# Patient Record
Sex: Male | Born: 1980 | Race: White | Hispanic: No | Marital: Single | Smoking: Current every day smoker
Health system: Southern US, Community
[De-identification: ages and names within clinical notes are randomized; demographics above are authoritative.]

---

## 2018-09-15 ENCOUNTER — Other Ambulatory Visit: Payer: Self-pay

## 2018-09-15 ENCOUNTER — Emergency Department (HOSPITAL_COMMUNITY): Payer: Self-pay

## 2018-09-15 ENCOUNTER — Encounter (HOSPITAL_COMMUNITY): Payer: Self-pay | Admitting: Emergency Medicine

## 2018-09-15 ENCOUNTER — Emergency Department (HOSPITAL_COMMUNITY)
Admission: EM | Admit: 2018-09-15 | Discharge: 2018-09-16 | Disposition: A | Discharge status: Home / Self Care | Payer: Self-pay | Attending: Emergency Medicine | Admitting: Emergency Medicine

## 2018-09-15 DIAGNOSIS — S51821A Laceration with foreign body of right forearm, initial encounter: Secondary | ICD-10-CM | POA: Insufficient documentation

## 2018-09-15 DIAGNOSIS — S301XXA Contusion of abdominal wall, initial encounter: Secondary | ICD-10-CM | POA: Insufficient documentation

## 2018-09-15 DIAGNOSIS — Y999 Unspecified external cause status: Secondary | ICD-10-CM | POA: Insufficient documentation

## 2018-09-15 DIAGNOSIS — S30811A Abrasion of abdominal wall, initial encounter: Secondary | ICD-10-CM | POA: Insufficient documentation

## 2018-09-15 DIAGNOSIS — S51822A Laceration with foreign body of left forearm, initial encounter: Secondary | ICD-10-CM | POA: Insufficient documentation

## 2018-09-15 DIAGNOSIS — F17200 Nicotine dependence, unspecified, uncomplicated: Secondary | ICD-10-CM | POA: Insufficient documentation

## 2018-09-15 DIAGNOSIS — S01112A Laceration without foreign body of left eyelid and periocular area, initial encounter: Secondary | ICD-10-CM | POA: Insufficient documentation

## 2018-09-15 DIAGNOSIS — Z1883 Retained stone or crystalline fragments: Secondary | ICD-10-CM | POA: Insufficient documentation

## 2018-09-15 DIAGNOSIS — Z23 Encounter for immunization: Secondary | ICD-10-CM | POA: Insufficient documentation

## 2018-09-15 DIAGNOSIS — S0101XA Laceration without foreign body of scalp, initial encounter: Secondary | ICD-10-CM | POA: Insufficient documentation

## 2018-09-15 DIAGNOSIS — Y92410 Unspecified street and highway as the place of occurrence of the external cause: Secondary | ICD-10-CM | POA: Insufficient documentation

## 2018-09-15 DIAGNOSIS — S30810A Abrasion of lower back and pelvis, initial encounter: Secondary | ICD-10-CM | POA: Insufficient documentation

## 2018-09-15 DIAGNOSIS — Y9389 Activity, other specified: Secondary | ICD-10-CM | POA: Insufficient documentation

## 2018-09-15 LAB — I-STAT CHEM 8, ED
BUN: 15 mg/dL (ref 6–20)
CHLORIDE: 112 mmol/L — AB (ref 98–111)
CREATININE: 0.9 mg/dL (ref 0.61–1.24)
Calcium, Ion: 1.09 mmol/L — ABNORMAL LOW (ref 1.15–1.40)
GLUCOSE: 128 mg/dL — AB (ref 70–99)
HCT: 44 % (ref 39.0–52.0)
Hemoglobin: 15 g/dL (ref 13.0–17.0)
POTASSIUM: 3.9 mmol/L (ref 3.5–5.1)
Sodium: 144 mmol/L (ref 135–145)
TCO2: 18 mmol/L — ABNORMAL LOW (ref 22–32)

## 2018-09-15 LAB — COMPREHENSIVE METABOLIC PANEL
ALK PHOS: 61 U/L (ref 38–126)
ALT: 31 U/L (ref 0–44)
AST: 39 U/L (ref 15–41)
Albumin: 4.2 g/dL (ref 3.5–5.0)
Anion gap: 12 (ref 5–15)
BUN: 13 mg/dL (ref 6–20)
CHLORIDE: 111 mmol/L (ref 98–111)
CO2: 20 mmol/L — AB (ref 22–32)
Calcium: 8.8 mg/dL — ABNORMAL LOW (ref 8.9–10.3)
Creatinine, Ser: 0.89 mg/dL (ref 0.61–1.24)
GLUCOSE: 124 mg/dL — AB (ref 70–99)
Potassium: 3.8 mmol/L (ref 3.5–5.1)
SODIUM: 143 mmol/L (ref 135–145)
Total Bilirubin: 0.4 mg/dL (ref 0.3–1.2)
Total Protein: 7.1 g/dL (ref 6.5–8.1)

## 2018-09-15 LAB — CDS SEROLOGY

## 2018-09-15 LAB — I-STAT CG4 LACTIC ACID, ED: Lactic Acid, Venous: 3.89 mmol/L (ref 0.5–1.9)

## 2018-09-15 LAB — CBC
HEMATOCRIT: 44.2 % (ref 39.0–52.0)
Hemoglobin: 13.8 g/dL (ref 13.0–17.0)
MCH: 31.2 pg (ref 26.0–34.0)
MCHC: 31.2 g/dL (ref 30.0–36.0)
MCV: 99.8 fL (ref 80.0–100.0)
NRBC: 0 % (ref 0.0–0.2)
Platelets: 345 10*3/uL (ref 150–400)
RBC: 4.43 MIL/uL (ref 4.22–5.81)
RDW: 13.6 % (ref 11.5–15.5)
WBC: 14 10*3/uL — ABNORMAL HIGH (ref 4.0–10.5)

## 2018-09-15 LAB — SAMPLE TO BLOOD BANK

## 2018-09-15 LAB — PROTIME-INR
INR: 0.94
PROTHROMBIN TIME: 12.5 s (ref 11.4–15.2)

## 2018-09-15 LAB — ETHANOL: Alcohol, Ethyl (B): 62 mg/dL — ABNORMAL HIGH (ref <10)

## 2018-09-15 MED ORDER — CEFAZOLIN SODIUM-DEXTROSE 2-4 GM/100ML-% IV SOLN
2.0000 g | Freq: Once | INTRAVENOUS | Status: AC
  Administered 2018-09-15: 2 g via INTRAVENOUS

## 2018-09-15 MED ORDER — CEFAZOLIN SODIUM-DEXTROSE 2-4 GM/100ML-% IV SOLN
INTRAVENOUS | Status: AC
  Filled 2018-09-15: qty 100

## 2018-09-15 MED ORDER — SODIUM CHLORIDE 0.9 % IV SOLN
INTRAVENOUS | Status: AC | PRN
  Administered 2018-09-15: 1000 mL via INTRAVENOUS

## 2018-09-15 MED ORDER — LIDOCAINE-EPINEPHRINE (PF) 2 %-1:200000 IJ SOLN
40.0000 mL | Freq: Once | INTRAMUSCULAR | Status: AC
  Administered 2018-09-16: 20 mL
  Filled 2018-09-15: qty 40

## 2018-09-15 MED ORDER — LIDOCAINE HCL (PF) 1 % IJ SOLN
60.0000 mL | Freq: Once | INTRAMUSCULAR | Status: DC
  Filled 2018-09-15: qty 60

## 2018-09-15 MED ORDER — FENTANYL CITRATE (PF) 100 MCG/2ML IJ SOLN
INTRAMUSCULAR | Status: AC
  Administered 2018-09-15: 50 ug
  Filled 2018-09-15: qty 2

## 2018-09-15 MED ORDER — TETANUS-DIPHTH-ACELL PERTUSSIS 5-2.5-18.5 LF-MCG/0.5 IM SUSP
INTRAMUSCULAR | Status: AC
  Filled 2018-09-15: qty 0.5

## 2018-09-15 MED ORDER — TETANUS-DIPHTH-ACELL PERTUSSIS 5-2.5-18.5 LF-MCG/0.5 IM SUSP
0.5000 mL | Freq: Once | INTRAMUSCULAR | Status: AC
  Administered 2018-09-15: 0.5 mL via INTRAMUSCULAR

## 2018-09-15 MED ORDER — IOHEXOL 300 MG/ML  SOLN
100.0000 mL | Freq: Once | INTRAMUSCULAR | Status: AC | PRN
  Administered 2018-09-15: 100 mL via INTRAVENOUS
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
## 2018-09-15 NOTE — ED Notes (Signed)
ED Provider at bedside for sutures. 
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
## 2018-09-15 NOTE — ED Notes (Signed)
Patient transported to CT 

## 2018-09-15 NOTE — ED Notes (Signed)
Nash Dimmer (wife) [phone] Chanetta Marshall (dad) [phone]

## 2018-09-16 MED ORDER — FENTANYL CITRATE (PF) 100 MCG/2ML IJ SOLN
50.0000 ug | Freq: Once | INTRAMUSCULAR | Status: AC
  Administered 2018-09-16: 50 ug via INTRAVENOUS
  Filled 2018-09-16: qty 2

## 2018-09-16 MED ORDER — DOXYCYCLINE HYCLATE 100 MG PO CAPS: 100.0000 mg | ORAL_CAPSULE | Freq: Two times a day (BID) | ORAL | 0 refills | Status: DC
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
## 2018-09-16 NOTE — Discharge Instructions (Addendum)
Follow up with trauma wound clinic.  Sutures and staples need to be removed in the next 7 to 10 days.  Keep abrasions and lacerations clean and dry with warm soapy water.  Apply bacitracin.  Wounds were contaminated so beware if you develop a fever, worsening redness, or pus draining from them.    Take 1 tablet of doxycycline 2 times daily for the next 7 days to prevent infection from your wounds.  Expect your soreness to increase over the next 2-3 days. Take it easy, but do not lay around too much as this may make any stiffness worse.  Antiinflammatory medications: Take 600 mg of ibuprofen every 6 hours or 440 mg (over the counter dose) to 500 mg (prescription dose) of naproxen every 12 hours for the next 3 days. After this time, these medications may be used as needed for pain. Take these medications with food to avoid upset stomach. Choose only one of these medications, do not take them together. Acetaminophen (generic for Tylenol): Should you continue to have additional pain while taking the ibuprofen or naproxen, you may add in acetaminophen as needed. Your daily total maximum amount of acetaminophen from all sources should be limited to 4000mg /day for persons without liver problems, or 2000mg /day for those with liver problems.

## 2018-10-05 ENCOUNTER — Other Ambulatory Visit: Payer: Self-pay

## 2018-10-05 ENCOUNTER — Encounter: Payer: Self-pay | Admitting: Emergency Medicine

## 2018-10-05 ENCOUNTER — Emergency Department
Admission: EM | Admit: 2018-10-05 | Discharge: 2018-10-05 | Disposition: A | Discharge status: Home / Self Care | Payer: Self-pay | Attending: Emergency Medicine | Admitting: Emergency Medicine

## 2018-10-05 DIAGNOSIS — S0191XD Laceration without foreign body of unspecified part of head, subsequent encounter: Secondary | ICD-10-CM | POA: Insufficient documentation

## 2018-10-05 DIAGNOSIS — Z4802 Encounter for removal of sutures: Secondary | ICD-10-CM | POA: Insufficient documentation

## 2018-10-05 DIAGNOSIS — F17200 Nicotine dependence, unspecified, uncomplicated: Secondary | ICD-10-CM | POA: Insufficient documentation

## 2018-10-05 NOTE — ED Provider Notes (Signed)
[facility] Emergency Department Provider Note   ____________________________________________   First MD Initiated Contact with Patient 10/05/18 1113     (approximate)  I have reviewed the triage vital signs and the nursing notes.   HISTORY  Chief Complaint Suture / Staple Removal   HPI ZERICK PREVETTE is a 37 y.o. male resents to the ED for staple removal.  Patient was seen at [facility] ED and received staples to a head laceration.  He states that this was approximately 3 weeks ago and he has not had transportation to have these removed until now.  He denies any symptoms of infection or headache.  History reviewed. No pertinent past medical history.  There are no active problems to display for this patient.   History reviewed. No pertinent surgical history.  Prior to Admission medications   Not on File    Allergies Patient has no known allergies.  History reviewed. No pertinent family history.  Social History Social History   Tobacco Use  . Smoking status: Current Every Day Smoker  . Smokeless tobacco: Never Used  Substance Use Topics  . Alcohol use: Never    Frequency: Never  . Drug use: Never    Review of Systems Constitutional: No fever/chills Eyes: No visual changes. ENT: No sore throat. Cardiovascular: Denies chest pain. Respiratory: Denies shortness of breath. Neurological: Negative for headaches, focal weakness or numbness. ___________________________________________   PHYSICAL EXAM:  VITAL SIGNS: ED Triage Vitals  Enc Vitals Group     BP 10/05/18 1105 (!) 148/80     Pulse Rate 10/05/18 1105 (!) 112     Resp 10/05/18 1105 20     Temp 10/05/18 1105 97.6 F (36.4 C)     Temp Source 10/05/18 1105 Oral     SpO2 10/05/18 1105 100 %     Weight 10/05/18 1106 159 lb (72.1 kg)     Height 10/05/18 1106 5\' 6"  (1.676 m)     Head Circumference --      Peak Flow --      Pain Score 10/05/18 1106 0     Pain Loc --      Pain Edu?  --      Excl. in GC? --    Constitutional: Alert and oriented. Well appearing and in no acute distress. Eyes: Conjunctivae are normal. PERRL. EOMI. Head: Atraumatic. Neck: No stridor.   Cardiovascular: Normal rate, regular rhythm. Grossly normal heart sounds.  Good peripheral circulation. Respiratory: Normal respiratory effort.  No retractions. Lungs CTAB. Musculoskeletal: Moves upper and lower extremities that any difficulty.  Normal gait was noted. Neurologic:  Normal speech and language. No gross focal neurologic deficits are appreciated.  Skin:  Skin is warm, dry and intact.  Psychiatric: Mood and affect are normal. Speech and behavior are normal.  ____________________________________________   LABS (all labs ordered are listed, but only abnormal results are displayed)  Labs Reviewed - No data to display   PROCEDURES  Procedure(s) performed: None  Procedures  Critical Care performed: No  ____________________________________________   INITIAL IMPRESSION / ASSESSMENT AND PLAN / ED COURSE  As part of my medical decision making, I reviewed the following data within the electronic MEDICAL RECORD NUMBER Notes from prior ED visits and [place] Controlled Substance Database  Patient presents to the ED for staple removal.  He was seen at [facility] ED for his injury.  He states he has not had any problems since and has had transportation problems getting staples removed and the  7-day period.  Area has healed without any signs of infection.  Patient was told that he may take Tylenol if needed for any pain.  ____________________________________________   FINAL CLINICAL IMPRESSION(S) / ED DIAGNOSES  Final diagnoses:  Encounter for staple removal     ED Discharge Orders    None       Note:  This document was prepared using Dragon voice recognition software and may include unintentional dictation errors.    Tommi Rumps, PA-C 10/05/18 1127    Nita Sickle, MD 10/06/18  [phone]
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
## 2018-10-05 NOTE — ED Notes (Signed)
See triage note  Here for staple removal from scalp   States they have been in place for about 3 weeks  No redness or drainage

## 2018-10-05 NOTE — Discharge Instructions (Addendum)
Clean area daily with mild soap and water.  Continue to watch for any signs of infection and return to the ED or be seen at [facility] acute care if any continued problems. You may take Tylenol if needed for pain today.

## 2018-10-30 ENCOUNTER — Emergency Department
Admission: EM | Admit: 2018-10-30 | Discharge: 2018-10-30 | Disposition: A | Discharge status: Home / Self Care | Payer: Self-pay | Attending: Emergency Medicine | Admitting: Emergency Medicine

## 2018-10-30 ENCOUNTER — Other Ambulatory Visit: Payer: Self-pay

## 2018-10-30 ENCOUNTER — Encounter: Payer: Self-pay | Admitting: Emergency Medicine

## 2018-10-30 DIAGNOSIS — F17200 Nicotine dependence, unspecified, uncomplicated: Secondary | ICD-10-CM | POA: Insufficient documentation

## 2018-10-30 DIAGNOSIS — S0101XD Laceration without foreign body of scalp, subsequent encounter: Secondary | ICD-10-CM | POA: Insufficient documentation

## 2018-10-30 DIAGNOSIS — Z4802 Encounter for removal of sutures: Secondary | ICD-10-CM

## 2018-10-30 DIAGNOSIS — W268XXD Contact with other sharp object(s), not elsewhere classified, subsequent encounter: Secondary | ICD-10-CM | POA: Insufficient documentation

## 2018-10-30 NOTE — ED Triage Notes (Signed)
staple L scalp. States had others removed previously, when he got a haircut this one showed still in.

## 2018-10-30 NOTE — ED Provider Notes (Signed)
[facility] Emergency Department Provider Note  ____________________________________________  Time seen: Approximately 10:43 AM  I have reviewed the triage vital signs and the nursing notes.   HISTORY  Chief Complaint Suture / Staple Removal    HPI Faythe CasaWestly W Cua is a 37 y.o. male presents to emergency department for one staple removal.  Patient states that he had staples removed on 11 6 here in the emergency department.  He went to get his haircut and they noticed he still had one staple in place.  He denies any complications.  History reviewed. No pertinent past medical history.  There are no active problems to display for this patient.   History reviewed. No pertinent surgical history.  Prior to Admission medications   Not on File    Allergies Patient has no known allergies.  No family history on file.  Social History Social History   Tobacco Use  . Smoking status: Current Every Day Smoker  . Smokeless tobacco: Never Used  Substance Use Topics  . Alcohol use: Never    Frequency: Never  . Drug use: Never     Review of Systems  Constitutional: No fever/chills Gastrointestinal: No nausea, no vomiting.  Musculoskeletal: Negative for musculoskeletal pain. Skin: Negative for rash, ecchymosis. Neurological: Negative for headaches, numbness or tingling   ____________________________________________   PHYSICAL EXAM:  VITAL SIGNS: ED Triage Vitals  Enc Vitals Group     BP 10/30/18 1024 121/85     Pulse Rate 10/30/18 1024 100     Resp 10/30/18 1024 18     Temp 10/30/18 1024 97.9 F (36.6 C)     Temp Source 10/30/18 1024 Oral     SpO2 10/30/18 1024 99 %     Weight 10/30/18 1026 165 lb (74.8 kg)     Height 10/30/18 1026 5\' 8"  (1.727 m)     Head Circumference --      Peak Flow --      Pain Score 10/30/18 1026 0     Pain Loc --      Pain Edu? --      Excl. in GC? --      Constitutional: Alert and oriented. Well appearing and  in no acute distress. Eyes: Conjunctivae are normal. PERRL. EOMI. Head:  ENT:      Ears:      Nose: No congestion/rhinnorhea.      Mouth/Throat: Mucous membranes are moist.  Neck: No stridor.  Cardiovascular: Normal rate, regular rhythm.  Good peripheral circulation. Respiratory: Normal respiratory effort without tachypnea or retractions. Lungs CTAB. Good air entry to the bases with no decreased or absent breath sounds. Musculoskeletal: Full range of motion to all extremities. No gross deformities appreciated. Neurologic:  Normal speech and language. No gross focal neurologic deficits are appreciated.  Skin:  Skin is warm, dry.  One staple in place to left scalp.  No tenderness.  No drainage. Psychiatric: Mood and affect are normal. Speech and behavior are normal. Patient exhibits appropriate insight and judgement.   ____________________________________________   LABS (all labs ordered are listed, but only abnormal results are displayed)  Labs Reviewed - No data to display ____________________________________________  EKG   ____________________________________________  RADIOLOGY   No results found.  ____________________________________________    PROCEDURES  Procedure(s) performed:    Procedures  SUTURE REMOVAL  Consent: Verbal consent obtained. Patient identity confirmed: provided demographic data Time out: Immediately prior to procedure a "time out" was called to verify the correct patient, procedure, equipment, support  staff and site/side marked as required.  Location details: scalp  Wound Appearance: clean  Sutures/Staples Removed: 1  Facility: staples placed in this facility Patient tolerance: Patient tolerated the procedure well with no immediate complications.    Medications - No data to display   ____________________________________________   INITIAL IMPRESSION / ASSESSMENT AND PLAN / ED COURSE  Pertinent labs & imaging results that were  available during my care of the patient were reviewed by me and considered in my medical decision making (see chart for details).  Review of the [place] CSRS was performed in accordance of the [facility] prior to dispensing any controlled drugs.     Patient presented to the emergency department for staple removal.  Patient had staples previously removed in the emergency department but one was missed.  Staple was removed here in the emergency department by RN.  No signs of infection.   Patient is to follow up with primary care as directed. Patient is given ED precautions to return to the ED for any worsening or new symptoms.     ____________________________________________  FINAL CLINICAL IMPRESSION(S) / ED DIAGNOSES  Final diagnoses:  Encounter for staple removal      NEW MEDICATIONS STARTED DURING THIS VISIT:  ED Discharge Orders    None          This chart was dictated using voice recognition software/Dragon. Despite best efforts to proofread, errors can occur which can change the meaning. Any change was purely unintentional.    Enid Derry, PA-C 10/30/18 1525    Governor Rooks, MD 11/01/18 1011

## 2018-10-30 NOTE — ED Notes (Signed)
See triage note  Here for 1 staple to be removed from left side of head
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
## 2022-06-23 ENCOUNTER — Emergency Department (HOSPITAL_COMMUNITY): Payer: Self-pay

## 2022-06-23 ENCOUNTER — Inpatient Hospital Stay (HOSPITAL_COMMUNITY)
Admission: EM | Admit: 2022-06-23 | Discharge: 2022-06-27 | DRG: 100 | Disposition: A | Discharge status: Home / Self Care | Payer: Self-pay | Attending: Internal Medicine | Admitting: Internal Medicine

## 2022-06-23 DIAGNOSIS — N170 Acute kidney failure with tubular necrosis: Secondary | ICD-10-CM | POA: Diagnosis present

## 2022-06-23 DIAGNOSIS — M6282 Rhabdomyolysis: Secondary | ICD-10-CM | POA: Diagnosis present

## 2022-06-23 DIAGNOSIS — E872 Acidosis, unspecified: Secondary | ICD-10-CM | POA: Diagnosis present

## 2022-06-23 DIAGNOSIS — Z23 Encounter for immunization: Secondary | ICD-10-CM

## 2022-06-23 DIAGNOSIS — G928 Other toxic encephalopathy: Secondary | ICD-10-CM | POA: Diagnosis present

## 2022-06-23 DIAGNOSIS — R000 Tachycardia, unspecified: Secondary | ICD-10-CM | POA: Diagnosis present

## 2022-06-23 DIAGNOSIS — Z20822 Contact with and (suspected) exposure to covid-19: Secondary | ICD-10-CM | POA: Diagnosis present

## 2022-06-23 DIAGNOSIS — N179 Acute kidney failure, unspecified: Secondary | ICD-10-CM

## 2022-06-23 DIAGNOSIS — Z781 Physical restraint status: Secondary | ICD-10-CM

## 2022-06-23 DIAGNOSIS — F17200 Nicotine dependence, unspecified, uncomplicated: Secondary | ICD-10-CM | POA: Diagnosis present

## 2022-06-23 DIAGNOSIS — F1010 Alcohol abuse, uncomplicated: Secondary | ICD-10-CM | POA: Diagnosis present

## 2022-06-23 DIAGNOSIS — R569 Unspecified convulsions: Secondary | ICD-10-CM

## 2022-06-23 DIAGNOSIS — G40901 Epilepsy, unspecified, not intractable, with status epilepticus: Principal | ICD-10-CM | POA: Diagnosis present

## 2022-06-23 DIAGNOSIS — F05 Delirium due to known physiological condition: Secondary | ICD-10-CM | POA: Diagnosis not present

## 2022-06-23 DIAGNOSIS — F1410 Cocaine abuse, uncomplicated: Secondary | ICD-10-CM | POA: Diagnosis present

## 2022-06-23 DIAGNOSIS — J9601 Acute respiratory failure with hypoxia: Secondary | ICD-10-CM | POA: Diagnosis present

## 2022-06-23 DIAGNOSIS — G9340 Encephalopathy, unspecified: Secondary | ICD-10-CM

## 2022-06-23 DIAGNOSIS — E875 Hyperkalemia: Secondary | ICD-10-CM | POA: Diagnosis present

## 2022-06-23 DIAGNOSIS — E871 Hypo-osmolality and hyponatremia: Secondary | ICD-10-CM | POA: Diagnosis not present

## 2022-06-23 DIAGNOSIS — J9602 Acute respiratory failure with hypercapnia: Secondary | ICD-10-CM | POA: Diagnosis present

## 2022-06-23 DIAGNOSIS — E860 Dehydration: Secondary | ICD-10-CM | POA: Diagnosis present

## 2022-06-23 DIAGNOSIS — R197 Diarrhea, unspecified: Secondary | ICD-10-CM | POA: Diagnosis present

## 2022-06-23 DIAGNOSIS — Z9911 Dependence on respirator [ventilator] status: Secondary | ICD-10-CM

## 2022-06-23 DIAGNOSIS — F1210 Cannabis abuse, uncomplicated: Secondary | ICD-10-CM | POA: Diagnosis present

## 2022-06-23 DIAGNOSIS — R34 Anuria and oliguria: Secondary | ICD-10-CM | POA: Diagnosis present

## 2022-06-23 LAB — PROTIME-INR
INR: 1.1 (ref 0.8–1.2)
Prothrombin Time: 14.3 seconds (ref 11.4–15.2)

## 2022-06-23 LAB — CBC WITH DIFFERENTIAL/PLATELET
Abs Immature Granulocytes: 0.67 10*3/uL — ABNORMAL HIGH (ref 0.00–0.07)
Band Neutrophils: 0 %
Basophils Absolute: 0.3 10*3/uL — ABNORMAL HIGH (ref 0.0–0.1)
Basophils Relative: 1 %
Blasts: 0 %
Eosinophils Absolute: 0 10*3/uL (ref 0.0–0.5)
Eosinophils Relative: 0 %
HCT: 46.7 % (ref 39.0–52.0)
Hemoglobin: 15.8 g/dL (ref 13.0–17.0)
Lymphocytes Relative: 5 %
Lymphs Abs: 1.7 10*3/uL (ref 0.7–4.0)
MCH: 33.8 pg (ref 26.0–34.0)
MCHC: 33.8 g/dL (ref 30.0–36.0)
MCV: 100 fL (ref 80.0–100.0)
Metamyelocytes Relative: 2 %
Monocytes Absolute: 3.4 10*3/uL — ABNORMAL HIGH (ref 0.1–1.0)
Monocytes Relative: 10 %
Myelocytes: 0 %
Neutro Abs: 27.5 10*3/uL — ABNORMAL HIGH (ref 1.7–7.7)
Neutrophils Relative %: 82 %
Other: 0 %
Platelets: 323 10*3/uL (ref 150–400)
Promyelocytes Relative: 0 %
RBC: 4.67 MIL/uL (ref 4.22–5.81)
RDW: 13.4 % (ref 11.5–15.5)
WBC: 33.6 10*3/uL — ABNORMAL HIGH (ref 4.0–10.5)
nRBC: 0 % (ref 0.0–0.2)
nRBC: 0 /100 WBC

## 2022-06-23 LAB — URINALYSIS, ROUTINE W REFLEX MICROSCOPIC
Bilirubin Urine: NEGATIVE
Glucose, UA: NEGATIVE mg/dL
Ketones, ur: NEGATIVE mg/dL
Leukocytes,Ua: NEGATIVE
Nitrite: NEGATIVE
Protein, ur: 100 mg/dL — AB
Specific Gravity, Urine: 1.018 (ref 1.005–1.030)
pH: 5 (ref 5.0–8.0)

## 2022-06-23 LAB — CK: Total CK: 2920 U/L — ABNORMAL HIGH (ref 49–397)

## 2022-06-23 LAB — COMPREHENSIVE METABOLIC PANEL
ALT: 37 U/L (ref 0–44)
AST: 77 U/L — ABNORMAL HIGH (ref 15–41)
Albumin: 5.1 g/dL — ABNORMAL HIGH (ref 3.5–5.0)
Alkaline Phosphatase: 66 U/L (ref 38–126)
Anion gap: 15 (ref 5–15)
BUN: 31 mg/dL — ABNORMAL HIGH (ref 6–20)
CO2: 20 mmol/L — ABNORMAL LOW (ref 22–32)
Calcium: 9.7 mg/dL (ref 8.9–10.3)
Chloride: 111 mmol/L (ref 98–111)
Creatinine, Ser: 4.07 mg/dL — ABNORMAL HIGH (ref 0.61–1.24)
GFR, Estimated: 18 mL/min — ABNORMAL LOW (ref >60)
Glucose, Bld: 120 mg/dL — ABNORMAL HIGH (ref 70–99)
Potassium: 5.2 mmol/L — ABNORMAL HIGH (ref 3.5–5.1)
Sodium: 146 mmol/L — ABNORMAL HIGH (ref 135–145)
Total Bilirubin: 0.9 mg/dL (ref 0.3–1.2)
Total Protein: 8 g/dL (ref 6.5–8.1)

## 2022-06-23 LAB — RAPID HIV SCREEN (HIV 1/2 AB+AG)
HIV 1/2 Antibodies: NONREACTIVE
HIV-1 P24 Antigen - HIV24: NONREACTIVE

## 2022-06-23 LAB — I-STAT ARTERIAL BLOOD GAS, ED
Acid-base deficit: 2 mmol/L (ref 0.0–2.0)
Bicarbonate: 25.7 mmol/L (ref 20.0–28.0)
Calcium, Ion: 1.25 mmol/L (ref 1.15–1.40)
HCT: 48 % (ref 39.0–52.0)
Hemoglobin: 16.3 g/dL (ref 13.0–17.0)
O2 Saturation: 100 %
Potassium: 4.7 mmol/L (ref 3.5–5.1)
Sodium: 146 mmol/L — ABNORMAL HIGH (ref 135–145)
TCO2: 27 mmol/L (ref 22–32)
pCO2 arterial: 53.8 mmHg — ABNORMAL HIGH (ref 32–48)
pH, Arterial: 7.287 — ABNORMAL LOW (ref 7.35–7.45)
pO2, Arterial: 451 mmHg — ABNORMAL HIGH (ref 83–108)

## 2022-06-23 LAB — RESP PANEL BY RT-PCR (FLU A&B, COVID) ARPGX2
Influenza A by PCR: NEGATIVE
Influenza B by PCR: NEGATIVE
SARS Coronavirus 2 by RT PCR: NEGATIVE

## 2022-06-23 LAB — LACTIC ACID, PLASMA
Lactic Acid, Venous: 3.1 mmol/L (ref 0.5–1.9)
Lactic Acid, Venous: >9 mmol/L (ref 0.5–1.9)

## 2022-06-23 LAB — RAPID URINE DRUG SCREEN, HOSP PERFORMED
Amphetamines: NOT DETECTED
Barbiturates: NOT DETECTED
Benzodiazepines: POSITIVE — AB
Cocaine: POSITIVE — AB
Opiates: NOT DETECTED
Tetrahydrocannabinol: POSITIVE — AB

## 2022-06-23 LAB — CBG MONITORING, ED: Glucose-Capillary: 74 mg/dL (ref 70–99)

## 2022-06-23 LAB — ACETAMINOPHEN LEVEL: Acetaminophen (Tylenol), Serum: <10 ug/mL — ABNORMAL LOW (ref 10–30)

## 2022-06-23 LAB — C DIFFICILE QUICK SCREEN W PCR REFLEX
C Diff antigen: NEGATIVE
C Diff interpretation: NOT DETECTED
C Diff toxin: NEGATIVE

## 2022-06-23 LAB — TROPONIN I (HIGH SENSITIVITY)
Troponin I (High Sensitivity): 315 ng/L (ref <18)
Troponin I (High Sensitivity): 267 ng/L (ref <18)

## 2022-06-23 LAB — SALICYLATE LEVEL: Salicylate Lvl: <7 mg/dL — ABNORMAL LOW (ref 7.0–30.0)

## 2022-06-23 LAB — ETHANOL: Alcohol, Ethyl (B): <10 mg/dL (ref <10)

## 2022-06-23 LAB — APTT: aPTT: 23 seconds — ABNORMAL LOW (ref 24–36)

## 2022-06-23 MED ORDER — LEVETIRACETAM IN NACL 1500 MG/100ML IV SOLN
1500.0000 mg | Freq: Once | INTRAVENOUS | Status: AC
  Administered 2022-06-23: 1500 mg via INTRAVENOUS
  Filled 2022-06-23: qty 100

## 2022-06-23 MED ORDER — VANCOMYCIN HCL IN DEXTROSE 1-5 GM/200ML-% IV SOLN: 1000.0000 mg | Freq: Once | INTRAVENOUS | Status: DC

## 2022-06-23 MED ORDER — FENTANYL 2500MCG IN NS 250ML (10MCG/ML) PREMIX INFUSION
50.0000 ug/h | INTRAVENOUS | Status: DC
  Administered 2022-06-23: 50 ug/h via INTRAVENOUS
  Administered 2022-06-25: 100 ug/h via INTRAVENOUS
  Filled 2022-06-23 (×2): qty 250

## 2022-06-23 MED ORDER — VANCOMYCIN VARIABLE DOSE PER UNSTABLE RENAL FUNCTION (PHARMACIST DOSING): Status: DC

## 2022-06-23 MED ORDER — ROCURONIUM BROMIDE 50 MG/5ML IV SOLN
INTRAVENOUS | Status: DC | PRN
  Administered 2022-06-23: 100 mg via INTRAVENOUS

## 2022-06-23 MED ORDER — PROPOFOL 1000 MG/100ML IV EMUL
0.0000 ug/kg/min | INTRAVENOUS | Status: DC
  Administered 2022-06-23: 20 ug/kg/min via INTRAVENOUS
  Administered 2022-06-24: 30 ug/kg/min via INTRAVENOUS
  Administered 2022-06-24: 20 ug/kg/min via INTRAVENOUS
  Administered 2022-06-24 (×2): 30 ug/kg/min via INTRAVENOUS
  Administered 2022-06-25: 40 ug/kg/min via INTRAVENOUS
  Administered 2022-06-25: 35 ug/kg/min via INTRAVENOUS
  Administered 2022-06-25: 40 ug/kg/min via INTRAVENOUS
  Filled 2022-06-23 (×8): qty 100

## 2022-06-23 MED ORDER — FENTANYL BOLUS VIA INFUSION: 50.0000 ug | INTRAVENOUS | Status: DC | PRN

## 2022-06-23 MED ORDER — LACTATED RINGERS IV SOLN: INTRAVENOUS | Status: AC

## 2022-06-23 MED ORDER — PANTOPRAZOLE 2 MG/ML SUSPENSION
40.0000 mg | Freq: Every day | ORAL | Status: DC
Start: 2022-06-24 — End: 2022-06-26
  Administered 2022-06-24 – 2022-06-25 (×2): 40 mg
  Filled 2022-06-23 (×2): qty 20

## 2022-06-23 MED ORDER — LORAZEPAM 2 MG/ML IJ SOLN
4.0000 mg | INTRAMUSCULAR | Status: DC | PRN
Start: 2022-06-23 — End: 2022-06-26

## 2022-06-23 MED ORDER — SODIUM CHLORIDE 0.9 % IV SOLN
2.0000 g | Freq: Once | INTRAVENOUS | Status: AC
  Administered 2022-06-23: 2 g via INTRAVENOUS
  Filled 2022-06-23: qty 20

## 2022-06-23 MED ORDER — DOCUSATE SODIUM 100 MG PO CAPS: 100.0000 mg | ORAL_CAPSULE | Freq: Two times a day (BID) | ORAL | Status: DC | PRN

## 2022-06-23 MED ORDER — LORAZEPAM 2 MG/ML IJ SOLN
2.0000 mg | Freq: Once | INTRAMUSCULAR | Status: AC
  Administered 2022-06-23: 2 mg via INTRAVENOUS
  Filled 2022-06-23: qty 1

## 2022-06-23 MED ORDER — PANTOPRAZOLE SODIUM 40 MG PO TBEC: 40.0000 mg | DELAYED_RELEASE_TABLET | Freq: Every day | ORAL | Status: DC

## 2022-06-23 MED ORDER — FENTANYL CITRATE PF 50 MCG/ML IJ SOSY
50.0000 ug | PREFILLED_SYRINGE | Freq: Once | INTRAMUSCULAR | Status: AC
  Administered 2022-06-23: 50 ug via INTRAVENOUS
  Filled 2022-06-23: qty 1

## 2022-06-23 MED ORDER — MIDAZOLAM HCL 2 MG/2ML IJ SOLN: 5.0000 mg | Freq: Once | INTRAMUSCULAR | Status: AC

## 2022-06-23 MED ORDER — PROPOFOL 1000 MG/100ML IV EMUL: 5.0000 ug/kg/min | INTRAVENOUS | Status: DC

## 2022-06-23 MED ORDER — POLYETHYLENE GLYCOL 3350 17 G PO PACK: 17.0000 g | PACK | Freq: Every day | ORAL | Status: DC | PRN

## 2022-06-23 MED ORDER — VANCOMYCIN HCL 1750 MG/350ML IV SOLN
1750.0000 mg | Freq: Once | INTRAVENOUS | Status: AC
  Administered 2022-06-23: 1750 mg via INTRAVENOUS
  Filled 2022-06-23: qty 350

## 2022-06-23 MED ORDER — DEXAMETHASONE SODIUM PHOSPHATE 10 MG/ML IJ SOLN
10.0000 mg | Freq: Once | INTRAMUSCULAR | Status: AC
  Administered 2022-06-23: 10 mg via INTRAVENOUS
  Filled 2022-06-23: qty 1

## 2022-06-23 MED ORDER — LORAZEPAM 2 MG/ML IJ SOLN: 2.0000 mg | Freq: Once | INTRAMUSCULAR | Status: AC

## 2022-06-23 MED ORDER — MIDAZOLAM HCL 2 MG/2ML IJ SOLN
INTRAMUSCULAR | Status: AC
  Administered 2022-06-23: 5 mg via INTRAVENOUS
  Filled 2022-06-23: qty 6

## 2022-06-23 MED ORDER — ETOMIDATE 2 MG/ML IV SOLN
INTRAVENOUS | Status: DC | PRN
  Administered 2022-06-23: 20 mg via INTRAVENOUS

## 2022-06-23 MED ORDER — LACTATED RINGERS IV BOLUS (SEPSIS)
1000.0000 mL | Freq: Once | INTRAVENOUS | Status: AC
  Administered 2022-06-23: 1000 mL via INTRAVENOUS

## 2022-06-23 MED ORDER — LACTATED RINGERS IV BOLUS
1000.0000 mL | Freq: Once | INTRAVENOUS | Status: AC
  Administered 2022-06-23: 1000 mL via INTRAVENOUS

## 2022-06-23 MED ORDER — LORAZEPAM 2 MG/ML IJ SOLN
INTRAMUSCULAR | Status: AC
  Administered 2022-06-23: 2 mg via INTRAVENOUS
  Filled 2022-06-23: qty 1

## 2022-06-23 NOTE — Care Plan (Signed)
LTM eeg reviewed till 2144. No seizure, no ictal-interictal activity. Please review final report for details.   Lance Franklin Annabelle Harman

## 2022-06-23 NOTE — Progress Notes (Signed)
Techs waiting on nursing staff to wrap up before EEG placement.
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
## 2022-06-23 NOTE — Progress Notes (Signed)
LTM EEG hooked up and running - no initial skin breakdown - push button tested - neuro notified. No [facility] monitoring; pt is in the ED.

## 2022-06-23 NOTE — Progress Notes (Signed)
Pharmacy Antibiotic Note  Lance Franklin is a 41 y.o. male for which pharmacy has been consulted for vancomycin dosing for meningitis. Patient presenting with AMS. Patient is intubated.  SCr 4.07 WBC 33.6; LA 3.1; T 99.4 F; HR 101; RR 20  Plan: Vancomycin 1750 mg once, subsequent dosing as indicated per random vancomycin level until renal function stable and/or improved, at which time scheduled dosing can be considered Trend WBC, Fever, Renal function, & Clinical course F/u cultures, clinical course, WBC, fever De-escalate when able Levels at steady state  Height: 5\' 8"  (172.7 cm) IBW/kg (Calculated) : 68.4  No data recorded.  No results for input(s): "WBC", "CREATININE", "LATICACIDVEN", "VANCOTROUGH", "VANCOPEAK", "VANCORANDOM", "GENTTROUGH", "GENTPEAK", "GENTRANDOM", "TOBRATROUGH", "TOBRAPEAK", "TOBRARND", "AMIKACINPEAK", "AMIKACINTROU", "AMIKACIN" in the last 168 hours.  CrCl cannot be calculated (Patient's most recent lab result is older than the maximum 21 days allowed.).    No Known Allergies  Antimicrobials this admission: rocephin 7/24 >>  vancomycin 7/24 >>   Microbiology results: Pending  Thank you for allowing pharmacy to be a part of this patient's care.  8/24, PharmD, BCPS 06/23/2022 7:55 PM ED Clinical Pharmacist -  [phone]

## 2022-06-23 NOTE — ED Notes (Signed)
[phone] Chanetta Marshall or Tracey Harries (dad or sister will answer) Please call with an update

## 2022-06-23 NOTE — Sepsis Progress Note (Signed)
Elink monitoring Code Sepsis  

## 2022-06-23 NOTE — ED Triage Notes (Signed)
Pt bib [facility] from work where he was found passed out on the ground by coworker, not responding to questions. With ems, pt had 3 episodes of seizure like activity where his arms were contracted and face was twitching lasting approx 3-4 minutes each. 5mg  versed given pta. Vomited with ems x1. Pt only oriented to self with ems.   HR 170, BP 143/111

## 2022-06-23 NOTE — ED Provider Notes (Signed)
[facility] EMERGENCY DEPARTMENT Provider Note   CSN: [id number] Arrival date & time: 06/23/22  1900     History  Chief Complaint  Patient presents with   Altered Mental Status    Lance Franklin is a 41 y.o. male.  Level 5 caveat for altered mental status and acuity of condition.  Patient brought in by EMS with seizure-like activity.  History is limited.  He was apparently found down at his worksite by coworkers seizing.  No known seizure history but does have a history of substance abuse.  EMS gave him 5 mg of Versed for tonic-clonic seizure activity with posturing.  On arrival he is still seizing moving his arms and legs in rhythmic fashion and not responding to questioning or commands.  He is tachycardic and tachypneic and feels warm. Patient unable to speak or give any history  Per sister Tracey Harries, patient has crack addiction, smokes it. 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 Concern it was laced with fentanyl. No prior history of seizures. No prescribed medication.   The history is provided by the patient and the EMS personnel. The history is limited by the condition of the patient.  Altered Mental Status      Home Medications Prior to Admission medications   Not on File      Allergies    Patient has no known allergies.    Review of Systems   Review of Systems  Unable to perform ROS: Mental status change    Physical Exam Updated Vital Signs BP (!) 89/68   Pulse (!) 110   Temp 99.3 F (37.4 C)   Resp 20   Ht 5\' 8"  (1.727 m)   Wt 77.1 kg   SpO2 100%   BMI 25.85 kg/m  Physical Exam Constitutional:      General: He is in acute distress.     Appearance: He is ill-appearing and toxic-appearing.     Comments: Rhythmic seizing on arrival involving arms and legs.  Upward gaze.  Dry mucous membranes  HENT:     Head: Normocephalic and atraumatic.     Nose: No congestion or rhinorrhea.     Mouth/Throat:      Pharynx: No oropharyngeal exudate.  Eyes:     Extraocular Movements: Extraocular movements intact.     Pupils: Pupils are equal, round, and reactive to light.  Cardiovascular:     Rate and Rhythm: Tachycardia present.  Pulmonary:     Effort: No respiratory distress.  Abdominal:     Tenderness: There is no abdominal tenderness. There is no guarding or rebound.  Musculoskeletal:        General: No swelling or tenderness. Normal range of motion.  Skin:    Comments: Disheveled, foul-smelling, incontinent of urine  Neurological:     Comments: Seizure activity involving arms and legs.  Patient not following commands.  Not moving extremities voluntarily     ED Results / Procedures / Treatments   Labs (all labs ordered are listed, but only abnormal results are displayed) Labs Reviewed  LACTIC ACID, PLASMA - Abnormal; Notable for the following components:      Result Value   Lactic Acid, Venous 3.1 (*)    All other components within normal limits  LACTIC ACID, PLASMA - Abnormal; Notable for the following components:   Lactic Acid, Venous >9.0 (*)    All other components within normal limits  CBC WITH DIFFERENTIAL/PLATELET - Abnormal; Notable for the following components:  WBC 33.6 (*)    Neutro Abs 27.5 (*)    Monocytes Absolute 3.4 (*)    Basophils Absolute 0.3 (*)    Abs Immature Granulocytes 0.67 (*)    All other components within normal limits  COMPREHENSIVE METABOLIC PANEL - Abnormal; Notable for the following components:   Sodium 146 (*)    Potassium 5.2 (*)    CO2 20 (*)    Glucose, Bld 120 (*)    BUN 31 (*)    Creatinine, Ser 4.07 (*)    Albumin 5.1 (*)    AST 77 (*)    GFR, Estimated 18 (*)    All other components within normal limits  ACETAMINOPHEN LEVEL - Abnormal; Notable for the following components:   Acetaminophen (Tylenol), Serum <10 (*)    All other components within normal limits  SALICYLATE LEVEL - Abnormal; Notable for the following components:    Salicylate Lvl <7.0 (*)    All other components within normal limits  RAPID URINE DRUG SCREEN, HOSP PERFORMED - Abnormal; Notable for the following components:   Cocaine POSITIVE (*)    Benzodiazepines POSITIVE (*)    Tetrahydrocannabinol POSITIVE (*)    All other components within normal limits  CK - Abnormal; Notable for the following components:   Total CK 2,920 (*)    All other components within normal limits  APTT - Abnormal; Notable for the following components:   aPTT 23 (*)    All other components within normal limits  URINALYSIS, ROUTINE W REFLEX MICROSCOPIC - Abnormal; Notable for the following components:   Color, Urine AMBER (*)    APPearance CLOUDY (*)    Hgb urine dipstick LARGE (*)    Protein, ur 100 (*)    Bacteria, UA FEW (*)    All other components within normal limits  I-STAT ARTERIAL BLOOD GAS, ED - Abnormal; Notable for the following components:   pH, Arterial 7.287 (*)    pCO2 arterial 53.8 (*)    pO2, Arterial 451 (*)    Sodium 146 (*)    All other components within normal limits  TROPONIN I (HIGH SENSITIVITY) - Abnormal; Notable for the following components:   Troponin I (High Sensitivity) 315 (*)    All other components within normal limits  TROPONIN I (HIGH SENSITIVITY) - Abnormal; Notable for the following components:   Troponin I (High Sensitivity) 267 (*)    All other components within normal limits  RESP PANEL BY RT-PCR (FLU A&B, COVID) ARPGX2  CULTURE, BLOOD (ROUTINE X 2)  CULTURE, BLOOD (ROUTINE X 2)  URINE CULTURE  C DIFFICILE QUICK SCREEN W PCR REFLEX    GASTROINTESTINAL PANEL BY PCR, STOOL (REPLACES STOOL CULTURE)  ETHANOL  PROTIME-INR  RAPID HIV SCREEN (HIV 1/2 AB+AG)  TRIGLYCERIDES  PATHOLOGIST SMEAR REVIEW  BASIC METABOLIC PANEL  CBC  MAGNESIUM  PHOSPHORUS  CK  LACTIC ACID, PLASMA  LACTIC ACID, PLASMA  LACTIC ACID, PLASMA  CBG MONITORING, ED    EKG EKG Interpretation  Date/Time:  Monday June 23 2022 19:04:13  EDT Ventricular Rate:  153 PR Interval:  82 QRS Duration: 85 QT Interval:  272 QTC Calculation: 434 R Axis:   90 Text Interpretation: Sinus tachycardia Borderline right axis deviation Borderline ST elevation, anterior leads No previous ECGs available Confirmed by Glynn Octave [phone]) on 06/23/2022 7:45:57 PM  Radiology CT Head Wo Contrast  Result Date: 06/23/2022 CLINICAL DATA:  Patient was found down by coworkers. Witnessed seizures leg activity followed by emesis. Head and neck  trauma EXAM: CT HEAD WITHOUT CONTRAST CT CERVICAL SPINE WITHOUT CONTRAST TECHNIQUE: Multidetector CT imaging of the head and cervical spine was performed following the standard protocol without intravenous contrast. Multiplanar CT image reconstructions of the cervical spine were also generated. RADIATION DOSE REDUCTION: This exam was performed according to the departmental dose-optimization program which includes automated exposure control, adjustment of the mA and/or kV according to patient size and/or use of iterative reconstruction technique. COMPARISON:  CT examination dated September 15, 2018 FINDINGS: CT HEAD FINDINGS Brain: No evidence of acute infarction, hemorrhage, hydrocephalus, extra-axial collection or mass lesion/mass effect. Vascular: No hyperdense vessel or unexpected calcification. Skull: Normal. Negative for fracture or focal lesion. Sinuses/Orbits: No acute finding. Other: None. CT CERVICAL SPINE FINDINGS Alignment: Normal. Skull base and vertebrae: No acute fracture. No primary bone lesion or focal pathologic process. Soft tissues and spinal canal: No prevertebral fluid or swelling. No visible canal hematoma. Disc levels: No significant disc protrusion, spinal canal or neural foraminal stenosis. Upper chest: Negative. Other: Endotracheal tube and feeding tube partially imaged. IMPRESSION: CT head: 1.  No acute intracranial abnormality. CT cervical spine: 1. No acute fracture or traumatic subluxation.  Paraspinal soft tissues are unremarkable. 2.  Endotracheal tube and feeding tube partially imaged. Electronically Signed   By: Larose Hires D.O.   On: 06/23/2022 22:49   CT Cervical Spine Wo Contrast  Result Date: 06/23/2022 CLINICAL DATA:  Patient was found down by coworkers. Witnessed seizures leg activity followed by emesis. Head and neck trauma EXAM: CT HEAD WITHOUT CONTRAST CT CERVICAL SPINE WITHOUT CONTRAST TECHNIQUE: Multidetector CT imaging of the head and cervical spine was performed following the standard protocol without intravenous contrast. Multiplanar CT image reconstructions of the cervical spine were also generated. RADIATION DOSE REDUCTION: This exam was performed according to the departmental dose-optimization program which includes automated exposure control, adjustment of the mA and/or kV according to patient size and/or use of iterative reconstruction technique. COMPARISON:  CT examination dated September 15, 2018 FINDINGS: CT HEAD FINDINGS Brain: No evidence of acute infarction, hemorrhage, hydrocephalus, extra-axial collection or mass lesion/mass effect. Vascular: No hyperdense vessel or unexpected calcification. Skull: Normal. Negative for fracture or focal lesion. Sinuses/Orbits: No acute finding. Other: None. CT CERVICAL SPINE FINDINGS Alignment: Normal. Skull base and vertebrae: No acute fracture. No primary bone lesion or focal pathologic process. Soft tissues and spinal canal: No prevertebral fluid or swelling. No visible canal hematoma. Disc levels: No significant disc protrusion, spinal canal or neural foraminal stenosis. Upper chest: Negative. Other: Endotracheal tube and feeding tube partially imaged. IMPRESSION: CT head: 1.  No acute intracranial abnormality. CT cervical spine: 1. No acute fracture or traumatic subluxation. Paraspinal soft tissues are unremarkable. 2.  Endotracheal tube and feeding tube partially imaged. Electronically Signed   By: Larose Hires D.O.   On:  06/23/2022 22:49   DG Chest Portable 1 View  Result Date: 06/23/2022 CLINICAL DATA:  Seizure activity, status post endotracheal tube and orogastric tube placement. EXAM: PORTABLE CHEST 1 VIEW COMPARISON:  September 15, 2018 FINDINGS: An endotracheal tube is seen with its distal tip approximately 4.8 cm from the carina. An orogastric tube is in place with its distal tip overlying the body of the stomach. Its distal side hole sits approximally 4.1 cm distal to the expected region of the gastroesophageal junction. The heart size and mediastinal contours are within normal limits. Both lungs are clear. The visualized skeletal structures are unremarkable. IMPRESSION: 1. Endotracheal tube and orogastric tube positioning, as described above.  2. No acute or active cardiopulmonary disease. Electronically Signed   By: Virgina Norfolk M.D.   On: 06/23/2022 19:47    Procedures Procedure Name: Intubation Date/Time: 06/23/2022 7:52 PM  Performed by: Ezequiel Essex, MDPre-anesthesia Checklist: Patient identified, Patient being monitored, Emergency Drugs available, Timeout performed and Suction available Oxygen Delivery Method: Non-rebreather mask Preoxygenation: Pre-oxygenation with 100% oxygen Induction Type: Rapid sequence Ventilation: Mask ventilation without difficulty Laryngoscope Size: Glidescope and 4 Grade View: Grade I Tube size: 7.5 mm Number of attempts: 1 Airway Equipment and Method: Lighted stylet, Patient positioned with wedge pillow and Video-laryngoscopy Placement Confirmation: ETT inserted through vocal cords under direct vision, CO2 detector, Breath sounds checked- equal and bilateral and Positive ETCO2 Secured at: 23 cm Tube secured with: ETT holder Dental Injury: Teeth and Oropharynx as per pre-operative assessment  Difficulty Due To: Difficult Airway- due to limited oral opening Future Recommendations: Recommend- induction with short-acting agent, and alternative techniques readily  available    .Critical Care  Performed by: Ezequiel Essex, MD Authorized by: Ezequiel Essex, MD   Critical care provider statement:    Critical care time (minutes):  60   Critical care time was exclusive of:  Separately billable procedures and treating other patients   Critical care was necessary to treat or prevent imminent or life-threatening deterioration of the following conditions:  Sepsis and CNS failure or compromise   Critical care was time spent personally by me on the following activities:  Development of treatment plan with patient or surrogate, discussions with consultants, evaluation of patient's response to treatment, examination of patient, ordering and review of laboratory studies, ordering and review of radiographic studies, ordering and performing treatments and interventions, pulse oximetry, re-evaluation of patient's condition and review of old charts   I assumed direction of critical care for this patient from another provider in my specialty: no     Care discussed with: admitting provider and accepting provider at another facility       Medications Ordered in ED Medications  LORazepam (ATIVAN) 2 MG/ML injection (has no administration in time range)  lactated ringers bolus 1,000 mL (has no administration in time range)  LORazepam (ATIVAN) injection 2 mg (has no administration in time range)  levETIRAcetam (KEPPRA) IVPB 1500 mg/ 100 mL premix (has no administration in time range)  LORazepam (ATIVAN) injection 2 mg (has no administration in time range)  propofol (DIPRIVAN) 1000 MG/100ML infusion (has no administration in time range)  fentaNYL (SUBLIMAZE) injection 50 mcg (has no administration in time range)  fentaNYL 2546mcg in NS 220mL (51mcg/ml) infusion-PREMIX (has no administration in time range)  fentaNYL (SUBLIMAZE) bolus via infusion 50-100 mcg (has no administration in time range)    ED Course/ Medical Decision Making/ A&P                            Medical Decision Making Amount and/or Complexity of Data Reviewed Independent Historian: EMS Labs: ordered. Decision-making details documented in ED Course. Radiology: ordered and independent interpretation performed. Decision-making details documented in ED Course. ECG/medicine tests: ordered and independent interpretation performed. Decision-making details documented in ED Course.  Risk Prescription drug management. Decision regarding hospitalization.  Patient with apparent status epilepticus.  Unknown past medical history.  No documented history of seizures.  He is seizing on arrival and is tachypneic, tachycardic and probably febrile.  He received multiple doses of benzodiazepines without cessation of his seizure activity.  He was loaded with IV Keppra and plans  made for intubation for airway protection.  Patient intubated as above without difficulty.  Rectal temp is 97.7,no fever.  Leukocytosis noted of 33.  Lactic acidosis.  Patient given empiric antibiotics including vancomycin and Rocephin.  He does not have any meningismus and does not have any fever.  Given his substance abuse there is less concern for meningitis but still considered  Found To have acute renal failure, lactic acidosis, leukocytosis, drug screen positive for cocaine, THC and benzodiazepines.  Seizure activity has stopped after intubation and propofol.  Continue propofol and Keppra.  Discussed with Dr. Cheral Marker of neurology who is arrange for video EEG monitoring. Patient has no further seizure activity.  He was treated with broad-spectrum antibiotics and fluids.  Foley catheter placed.  EEG monitoring in process.  CT head and C-spine are pending.  Suspect seizures likely secondary to substance abuse.  No fever.  He is given empiric meningitis coverage but does not have any meningismus.  Also is having profuse diarrhea which would make lumbar puncture difficult  Admission discussed with Dr. Loanne Drilling critical care  service.  She agrees to hold off on lumbar puncture at this time given patient's no meningismus and no fever history of substance abuse and ongoing diarrhea.  Sister and father updated by phone        Final Clinical Impression(s) / ED Diagnoses Final diagnoses:  Status epilepticus ([place])  Acute respiratory failure with hypoxia ([place])  AKI (acute kidney injury) [facility])    Rx / DC Orders ED Discharge Orders     None         Karlene Southard, Annie Main, MD 06/23/22 2324

## 2022-06-24 ENCOUNTER — Inpatient Hospital Stay (HOSPITAL_COMMUNITY): Payer: Self-pay

## 2022-06-24 LAB — GASTROINTESTINAL PANEL BY PCR, STOOL (REPLACES STOOL CULTURE)

## 2022-06-24 LAB — BASIC METABOLIC PANEL
Anion gap: 11 (ref 5–15)
BUN: 34 mg/dL — ABNORMAL HIGH (ref 6–20)
CO2: 19 mmol/L — ABNORMAL LOW (ref 22–32)
Calcium: 8.5 mg/dL — ABNORMAL LOW (ref 8.9–10.3)
Chloride: 113 mmol/L — ABNORMAL HIGH (ref 98–111)
Creatinine, Ser: 2.85 mg/dL — ABNORMAL HIGH (ref 0.61–1.24)
GFR, Estimated: 28 mL/min — ABNORMAL LOW (ref >60)
Glucose, Bld: 106 mg/dL — ABNORMAL HIGH (ref 70–99)
Potassium: 3.9 mmol/L (ref 3.5–5.1)
Sodium: 143 mmol/L (ref 135–145)

## 2022-06-24 LAB — CBC
HCT: 38.4 % — ABNORMAL LOW (ref 39.0–52.0)
Hemoglobin: 13.2 g/dL (ref 13.0–17.0)
MCH: 33.8 pg (ref 26.0–34.0)
MCHC: 34.4 g/dL (ref 30.0–36.0)
MCV: 98.2 fL (ref 80.0–100.0)
Platelets: 169 10*3/uL (ref 150–400)
RBC: 3.91 MIL/uL — ABNORMAL LOW (ref 4.22–5.81)
RDW: 13.8 % (ref 11.5–15.5)
WBC: 17 10*3/uL — ABNORMAL HIGH (ref 4.0–10.5)
nRBC: 0 % (ref 0.0–0.2)

## 2022-06-24 LAB — LACTIC ACID, PLASMA
Lactic Acid, Venous: 1.2 mmol/L (ref 0.5–1.9)
Lactic Acid, Venous: 1.1 mmol/L (ref 0.5–1.9)
Lactic Acid, Venous: 1.4 mmol/L (ref 0.5–1.9)
Lactic Acid, Venous: 1.1 mmol/L (ref 0.5–1.9)

## 2022-06-24 LAB — URINE CULTURE: Culture: NO GROWTH

## 2022-06-24 LAB — CK: Total CK: 9737 U/L — ABNORMAL HIGH (ref 49–397)

## 2022-06-24 LAB — PHOSPHORUS: Phosphorus: 3.1 mg/dL (ref 2.5–4.6)

## 2022-06-24 LAB — MRSA NEXT GEN BY PCR, NASAL: MRSA by PCR Next Gen: NOT DETECTED

## 2022-06-24 LAB — MAGNESIUM: Magnesium: 2.1 mg/dL (ref 1.7–2.4)

## 2022-06-24 LAB — TRIGLYCERIDES: Triglycerides: 87 mg/dL (ref <150)

## 2022-06-24 MED ORDER — ADULT MULTIVITAMIN W/MINERALS CH
1.0000 | ORAL_TABLET | Freq: Every day | ORAL | Status: DC
  Administered 2022-06-24 – 2022-06-25 (×2): 1
  Filled 2022-06-24 (×2): qty 1

## 2022-06-24 MED ORDER — ORAL CARE MOUTH RINSE
15.0000 mL | OROMUCOSAL | Status: DC
  Administered 2022-06-24 – 2022-06-26 (×25): 15 mL via OROMUCOSAL

## 2022-06-24 MED ORDER — SODIUM CHLORIDE 0.9 % IV SOLN
750.0000 mg | Freq: Two times a day (BID) | INTRAVENOUS | Status: DC
  Administered 2022-06-24 – 2022-06-25 (×4): 750 mg via INTRAVENOUS
  Filled 2022-06-24 (×6): qty 7.5

## 2022-06-24 MED ORDER — HEPARIN SODIUM (PORCINE) 5000 UNIT/ML IJ SOLN
5000.0000 [IU] | Freq: Three times a day (TID) | INTRAMUSCULAR | Status: DC
  Administered 2022-06-24 – 2022-06-26 (×7): 5000 [IU] via SUBCUTANEOUS
  Filled 2022-06-24 (×7): qty 1

## 2022-06-24 MED ORDER — ORAL CARE MOUTH RINSE
15.0000 mL | OROMUCOSAL | Status: DC | PRN
Start: 2022-06-24 — End: 2022-06-27

## 2022-06-24 MED ORDER — LACTATED RINGERS IV SOLN: INTRAVENOUS | Status: AC

## 2022-06-24 MED ORDER — SODIUM CHLORIDE 0.9 % IV BOLUS
1000.0000 mL | Freq: Once | INTRAVENOUS | Status: AC
  Administered 2022-06-25: 1000 mL via INTRAVENOUS

## 2022-06-24 MED ORDER — CHLORHEXIDINE GLUCONATE CLOTH 2 % EX PADS
6.0000 | MEDICATED_PAD | Freq: Every day | CUTANEOUS | Status: DC
  Administered 2022-06-26: 6 via TOPICAL

## 2022-06-24 NOTE — Progress Notes (Signed)
vLTM maintenance   All impedances below 10kohms.  No skin breakdown noted at all skin sites 

## 2022-06-24 NOTE — Progress Notes (Signed)
  Transition of Care [facility]) Screening Note   Patient Details  Name: Lance Franklin Date of Birth: 09/17/1981   Transition of Care [facility]) CM/SW Contact:    Glennon Mac, RN Phone Number: 06/24/2022, 5:21 PM    Transition of Care Department [facility]) has reviewed patient and no TOC needs have been identified at this time. We will continue to monitor patient advancement through interdisciplinary progression rounds. If new patient transition needs arise, please place a TOC consult.  Quintella Baton, RN, BSN  Trauma/Neuro ICU Case Manager [phone]

## 2022-06-24 NOTE — Progress Notes (Signed)
UOP measured upon returning from MRI. Pt only had 15 cc in 90 minutes. Dr. Merrily Pew notified and LR at 150 reordered.

## 2022-06-24 NOTE — Progress Notes (Signed)
RT NOTE:  Pt transported to 4N20 without event. Report given to [place], RRT.

## 2022-06-24 NOTE — Progress Notes (Signed)
Initial Nutrition Assessment  DOCUMENTATION CODES:   Not applicable  INTERVENTION:   Multivitamin w/ minerals daily If unable to extubate, recommend starting tube feeds via OG: Start Vital 1.5 @ 25 mL/hr and advance by 10 mL q8h to goal rate of 55 mL/hr (1320 mL/day) 45 mL ProSource TF - BID Provides 2060 kcal, 111 gm protein, and 1008 mL free water daily.   NUTRITION DIAGNOSIS:   Inadequate oral intake related to inability to eat as evidenced by NPO status.   GOAL:   Patient will meet greater than or equal to 90% of their needs  MONITOR:   Vent status, Labs, Weight trends, I & O's  REASON FOR ASSESSMENT:   Ventilator    ASSESSMENT:   41 y.o. male presented to the ED after being found down and had witnessed seizure like activity. PMH includes polysubstance abuse. Pt admitted with acute toxic metabolic encephalopathy, suspect secondary to drug use, AKI, and acute hypoxemic respiratory failure.   Discussed with RN, no plan for TF at this time. Possible extubation soon.   Patient is currently intubated on ventilator support. MV: 10.6 L/min Temp (24hrs), Avg:99.1 F (37.3 C), Min:96.1 F (35.6 C), Max:100.5 F (38.1 C) Continuous Medications: Propofol: 14.4 ml/hr (380 kcal/day) Fentanyl Lactated Ringers Keppra  Medications reviewed and include: Protonix Labs reviewed: BUN 34, Creatinine 2.85   NUTRITION - FOCUSED PHYSICAL EXAM:  Flowsheet Row Most Recent Value  Orbital Region Unable to assess  Upper Arm Region Mild depletion  Thoracic and Lumbar Region Mild depletion  Buccal Region Unable to assess  Temple Region Unable to assess  Clavicle Bone Region No depletion  Clavicle and Acromion Bone Region No depletion  Scapular Bone Region No depletion  Dorsal Hand Unable to assess  Patellar Region No depletion  Anterior Thigh Region No depletion  Posterior Calf Region No depletion  Edema (RD Assessment) None  Hair Unable to assess  Eyes Unable to assess   Mouth Unable to assess  Skin Reviewed  Nails Unable to assess   Diet Order:   Diet Order             Diet NPO time specified  Diet effective now                   EDUCATION NEEDS:   Not appropriate for education at this time  Skin:  Skin Assessment: Reviewed RN Assessment  Last BM:  7/25 via rectal tube  Height:   Ht Readings from Last 1 Encounters:  06/23/22 5\' 8"  (1.727 m)    Weight:   Wt Readings from Last 1 Encounters:  06/23/22 77.1 kg    Ideal Body Weight:  70 kg  BMI:  Body mass index is 25.85 kg/m.  Estimated Nutritional Needs:   Kcal:  2000-2200  Protein:  100-115 grams  Fluid:  >/= 2 L   06/25/22 RD, LDN Clinical Dietitian See [facility] for contact information.

## 2022-06-24 NOTE — Progress Notes (Signed)
eLink Physician-Brief Progress Note Patient Name: Lance Franklin DOB: [birth year] MRN: [id number]   Date of Service  06/24/2022  HPI/Events of Note  Oliguria - Urine output 25 mL for this shift. No central venous line or CVP.  eICU Interventions  Plan: Bolus with 0.9 NaCl 1 liter IV over 1 hour now.      Intervention Category Major Interventions: Other:  Lenell Antu 06/24/2022, 11:51 PM

## 2022-06-24 NOTE — Procedures (Signed)
Patient Name: Lance Franklin  MRN: [id number]  Epilepsy Attending: Charlsie Quest  Referring Physician/Provider: Caryl Pina, MD  Duration: 06/23/2022 2112 to 06/24/2022 2112  Patient history: 41 year old male with altered mental status and status epilepticus.  EEG to evaluate for seizure  Level of alertness: comatose  AEDs during EEG study: Keppra, propofol  Technical aspects: This EEG study was done with scalp electrodes positioned according to the 10-20 International system of electrode placement. Electrical activity was acquired at a sampling rate of 500Hz  and reviewed with a high frequency filter of 70Hz  and a low frequency filter of 1Hz . EEG data were recorded continuously and digitally stored.   Description: EEG showed continuous generalized 3 to 5 Hz theta and delta slowing admixed with an excessive amount of 15 to 18 Hz beta activity distributed symmetrically and diffusely. Hyperventilation and photic stimulation were not performed.     Patient was unhooked between 06/24/2022 1629 to 1914 for MRI brain  ABNORMALITY - Continuous slow, generalized - Excessive beta, generalized  IMPRESSION: This study is suggestive of severe diffuse encephalopathy, nonspecific etiology but likely related to sedation. No seizures or epileptiform discharges were seen throughout the recording.  Buford Bremer 

## 2022-06-24 NOTE — Progress Notes (Signed)
Subjective: No seizures overnight.  ROS: Unable to obtain due to poor mental status  Examination  Vital signs in last 24 hours: Temp:  [96.1 F (35.6 C)-100.5 F (38.1 C)] 97.3 F (36.3 C) (07/25 1000) Pulse Rate:  [73-139] 73 (07/25 1000) Resp:  [15-45] 20 (07/25 1000) BP: (75-160)/(56-121) 99/74 (07/25 1000) SpO2:  [90 %-100 %] 98 % (07/25 1000) FiO2 (%):  [40 %-100 %] 40 % (07/25 0732) Weight:  [77.1 kg] 77.1 kg (07/24 2040)  General: lying in bed, intubated Neuro: On propofol at 16mcg/kg/min, opens eyes to tactile stimuli, did wiggle toes on the left side but did not follow other commands for me, PERRLA, blinks to threat bilaterally, withdraws to noxious stimuli in all 4 extremities  Basic Metabolic Panel: Recent Labs  Lab 06/23/22 1950 06/23/22 1959 06/24/22 0421  NA 146* 146* 143  K 5.2* 4.7 3.9  CL 111  --  113*  CO2 20*  --  19*  GLUCOSE 120*  --  106*  BUN 31*  --  34*  CREATININE 4.07*  --  2.85*  CALCIUM 9.7  --  8.5*  MG  --   --  2.1  PHOS  --   --  3.1    CBC: Recent Labs  Lab 06/23/22 1950 06/23/22 1959 06/24/22 0421  WBC 33.6*  --  17.0*  NEUTROABS 27.5*  --   --   HGB 15.8 16.3 13.2  HCT 46.7 48.0 38.4*  MCV 100.0  --  98.2  PLT 323  --  169     Coagulation Studies: Recent Labs    06/23/22 1950  LABPROT 14.3  INR 1.1    Imaging CT head without contrast 06/23/2022:  No acute intracranial abnormality.  ASSESSMENT AND PLAN: 41 year old male who presented with new onset seizure activity in the setting of cocaine use  New onset seizure Cocaine use disorder Rhabdomyolysis Acute encephalopathy -No seizures overnight  Recommendations -Plan to obtain MRI brain without contrast to assess for any acute abnormality -Plan to wean propofol at 10 mcg/h to stop after MRI Brain -Continue EEG while on sedation, will likely discontinue tomorrow -Continue Keppra 750 mg twice daily -Continue seizure precautions -Monitor for withdrawal after  sedation is weaned off -Will need substance abuse counseling when more awake -As needed IV Ativan 2 mg for clinical seizure-like activity -Management of rest of comorbidities per primary team -Discussed plan with Dr. Merrily Pew  CRITICAL CARE Performed by: Charlsie Quest   Total critical care time: 32 minutes  Critical care time was exclusive of separately billable procedures and treating other patients.  Critical care was necessary to treat or prevent imminent or life-threatening deterioration.  Critical care was time spent personally by me on the following activities: development of treatment plan with patient and/or surrogate as well as nursing, discussions with consultants, evaluation of patient's response to treatment, examination of patient, obtaining history from patient or surrogate, ordering and performing treatments and interventions, ordering and review of laboratory studies, ordering and review of radiographic studies, pulse oximetry and re-evaluation of patient's condition.  Lindie Spruce Epilepsy [facility] For questions after 5pm please refer to AMION to reach the Neurologist on call

## 2022-06-24 NOTE — Progress Notes (Signed)
eLink Physician-Brief Progress Note Patient Name: LINKEN MCGLOTHEN DOB: September 28, 1981 MRN: [id number]   Date of Service  06/24/2022  HPI/Events of Note  Agitation - Patient reaching for ETT. Nursing request for bilateral wrist restraints.   eICU Interventions  Will order bilateral soft wrist restraints X 5 hours.      Intervention Category Major Interventions: Delirium, psychosis, severe agitation - evaluation and management  Gildo Crisco Eugene 06/24/2022, 4:56 AM

## 2022-06-24 NOTE — Progress Notes (Signed)
Tech in room to remove EEG leads so patient could go to MRI since the leads weren't MRI compatible. Patient does need to be re hooked after he comes back from MRI.

## 2022-06-24 NOTE — Progress Notes (Signed)
NAME:  Lance Franklin, MRN:  [id number], DOB:  [birth year], LOS: 1 ADMISSION DATE:  06/23/2022, CONSULTATION DATE:  06/23/22 REFERRING MD:  Glynn Octave, MD CHIEF COMPLAINT:  Seizure, AMS   History of Present Illness:  41 year old male with history polysubstance abuse found down 7/24 by co-workers. EMS arrived and witnessed seizure like activity followed by emesis x 1. He was given Versed 5 mg for tonic clonic seizures which continued on arrival to the ED with rhythmic movements of upper and lower extremities. Ativan 2mg  x 2 was given with cessation of seizure. He was intubated for airway protection and started on propofol. Loaded with Keppra 1500 mg x 1. Neurology consulted. PCCM for admission.  Pertinent  Medical History  Crack abuse  Significant Hospital Events: Including procedures, antibiotic start and stop dates in addition to other pertinent events   7/24 Admitted 7/24 EEG no seizure activity  Interim History / Subjective:  No acute change overnight. Some agitation. Propofol, fentanyl gtts  Diarrhea resolved   Objective   Blood pressure 91/63, pulse 78, temperature 99.4 F (37.4 C), temperature source Axillary, resp. rate 20, height 5\' 8"  (1.727 m), weight 77.1 kg, SpO2 97 %.    Vent Mode: PRVC FiO2 (%):  [40 %-100 %] 40 % Set Rate:  [15 bmp-20 bmp] 20 bmp Vt Set:  [540 mL] 540 mL PEEP:  [5 cmH20] 5 cmH20 Plateau Pressure:  [13 cmH20-18 cmH20] 15 cmH20   Intake/Output Summary (Last 24 hours) at 06/24/2022 0830 Last data filed at 06/24/2022 0600 Gross per 24 hour  Intake 4318.31 ml  Output 650 ml  Net 3668.31 ml   Filed Weights   06/23/22 2040  Weight: 77.1 kg   Physical Exam: General: disheveled appearing young male, NAD sedated on vent  HENT: mm moist, ETT in place Eyes: EOMI, no scleral icterus Respiratory: resps even non labored on vent, few scattered rhonchi otherwise clear.  Cardiovascular: s1s2 rrr GI: BS+, soft,  nontender Extremities:-Edema,-tenderness Neuro: sedated on vent, RASS -2, withdraws to pain, intermittent agitation per nsg, pulls at ETT  Resolved Hospital Problem list   N/A  Assessment & Plan:   Acute toxic metabolic encephalopathy  Status epilepticus/Seizure, suspect secondary to recent drug use Substance abuse   Afebrile, low suspicion for infection. History and absence of clinical signs for meningitis UDS POS benzos, cocaine, [facility]  Plan Neurology following - On LTM and AEDs  Titrate propofol UDS pending S/p Vanc/ceftriaxone. Hold further antibiotics. F/u cultures Trend WBC/fever curve MRI  Daily WUA   AKI - improving  Mild rhabdomyolysis Hyperkalemia - resolved Gentle volume  Trend CK, LA, trop Trend UOP/Cr Minimize/avoid nephrotoxic agents  Acute hypoxemic respiratory failure secondary to above Full vent support. Wean FIO2 and PEEP per protocol F/u ABG PRN  F/u CXR in am  SBT/WUA when eligible. Mental status precludes extubation PAD protocol VAP  Diarrhea - in setting of seizures. Resolved.  GI panel and C.diff ordered DC bowel regimen Monitor output  Best Practice (right click and "Reselect all SmartList Selections" daily)   Diet/type: NPO DVT prophylaxis: systemic heparin Hold until CT Head reviewed GI prophylaxis: PPI Lines: N/A Foley:  Yes, and it is still needed Code Status:  full code Last date of multidisciplinary goals of care discussion []   Labs   CBC: Recent Labs  Lab 06/23/22 1950 06/23/22 1959 06/24/22 0421  WBC 33.6*  --  17.0*  NEUTROABS 27.5*  --   --   HGB 15.8 16.3 13.2  HCT  46.7 48.0 38.4*  MCV 100.0  --  98.2  PLT 323  --  169    Basic Metabolic Panel: Recent Labs  Lab 06/23/22 1950 06/23/22 1959 06/24/22 0421  NA 146* 146* 143  K 5.2* 4.7 3.9  CL 111  --  113*  CO2 20*  --  19*  GLUCOSE 120*  --  106*  BUN 31*  --  34*  CREATININE 4.07*  --  2.85*  CALCIUM 9.7  --  8.5*  MG  --   --  2.1  PHOS  --   --  3.1    GFR: Estimated Creatinine Clearance: 34 mL/min (A) (by C-G formula based on SCr of 2.85 mg/dL (H)). Recent Labs  Lab 06/23/22 1950 06/23/22 2000 06/23/22 2134 06/24/22 0421  WBC 33.6*  --   --  17.0*  LATICACIDVEN  --  3.1* >9.0* 1.2    Liver Function Tests: Recent Labs  Lab 06/23/22 1950  AST 77*  ALT 37  ALKPHOS 66  BILITOT 0.9  PROT 8.0  ALBUMIN 5.1*   No results for input(s): "LIPASE", "AMYLASE" in the last 168 hours. No results for input(s): "AMMONIA" in the last 168 hours.  ABG    Component Value Date/Time   PHART 7.287 (L) 06/23/2022 1959   PCO2ART 53.8 (H) 06/23/2022 1959   PO2ART 451 (H) 06/23/2022 1959   HCO3 25.7 06/23/2022 1959   TCO2 27 06/23/2022 1959   ACIDBASEDEF 2.0 06/23/2022 1959   O2SAT 100 06/23/2022 1959     Coagulation Profile: Recent Labs  Lab 06/23/22 1950  INR 1.1    Cardiac Enzymes: Recent Labs  Lab 06/23/22 1950  CKTOTAL 2,920*    HbA1C: No results found for: "HGBA1C"  CBG: Recent Labs  Lab 06/23/22 2130  GLUCAP 74     Critical care time: 35 min     The patient is critically ill with multiple organ systems failure and requires high complexity decision making for assessment and support, frequent evaluation and titration of therapies, application of advanced monitoring technologies and extensive interpretation of multiple databases.   Dirk Dress, NP Pulmonary/Critical Care Medicine  06/24/2022  8:30 AM   Please see Amion for pager number to reach on-call Pulmonary and Critical Care Team.

## 2022-06-24 NOTE — Progress Notes (Signed)
Pt transported to MRI and back to 4N20 without any complications.

## 2022-06-24 NOTE — Sepsis Progress Note (Signed)
Notified bedside nurse of need call lab to draw repeat lactic acid.

## 2022-06-24 NOTE — Progress Notes (Signed)
eLink Physician-Brief Progress Note Patient Name: Lance Franklin DOB: Mar 16, 1981 MRN: [id number]   Date of Service  06/24/2022  HPI/Events of Note  Review of Head and C-Spine CT Scans: CT head: 1.  No acute intracranial abnormality. CT cervical spine: 1. No acute fracture or traumatic subluxation. Paraspinal soft tissues are unremarkable. Creatinine = 4.07.  Will start DVT prophylaxis.  eICU Interventions  Plan: Heparin 5000 units Achille Q 8 hours.      Intervention Category Major Interventions: Other:  Lenell Antu 06/24/2022, 12:59 AM

## 2022-06-25 ENCOUNTER — Inpatient Hospital Stay (HOSPITAL_COMMUNITY): Payer: Self-pay

## 2022-06-25 LAB — CBC
HCT: 35.4 % — ABNORMAL LOW (ref 39.0–52.0)
Hemoglobin: 12.1 g/dL — ABNORMAL LOW (ref 13.0–17.0)
MCH: 34.4 pg — ABNORMAL HIGH (ref 26.0–34.0)
MCHC: 34.2 g/dL (ref 30.0–36.0)
MCV: 100.6 fL — ABNORMAL HIGH (ref 80.0–100.0)
Platelets: 149 10*3/uL — ABNORMAL LOW (ref 150–400)
RBC: 3.52 MIL/uL — ABNORMAL LOW (ref 4.22–5.81)
RDW: 14.2 % (ref 11.5–15.5)
WBC: 15.2 10*3/uL — ABNORMAL HIGH (ref 4.0–10.5)
nRBC: 0.1 % (ref 0.0–0.2)

## 2022-06-25 LAB — BASIC METABOLIC PANEL
Anion gap: 5 (ref 5–15)
BUN: 23 mg/dL — ABNORMAL HIGH (ref 6–20)
CO2: 20 mmol/L — ABNORMAL LOW (ref 22–32)
Calcium: 8 mg/dL — ABNORMAL LOW (ref 8.9–10.3)
Chloride: 117 mmol/L — ABNORMAL HIGH (ref 98–111)
Creatinine, Ser: 1.69 mg/dL — ABNORMAL HIGH (ref 0.61–1.24)
GFR, Estimated: 53 mL/min — ABNORMAL LOW (ref >60)
Glucose, Bld: 79 mg/dL (ref 70–99)
Potassium: 4.2 mmol/L (ref 3.5–5.1)
Sodium: 142 mmol/L (ref 135–145)

## 2022-06-25 LAB — PHOSPHORUS: Phosphorus: 2.9 mg/dL (ref 2.5–4.6)

## 2022-06-25 LAB — CK: Total CK: 16206 U/L — ABNORMAL HIGH (ref 49–397)

## 2022-06-25 LAB — MAGNESIUM: Magnesium: 2.3 mg/dL (ref 1.7–2.4)

## 2022-06-25 LAB — PATHOLOGIST SMEAR REVIEW

## 2022-06-25 MED ORDER — DEXMEDETOMIDINE HCL IN NACL 400 MCG/100ML IV SOLN
INTRAVENOUS | Status: AC
  Filled 2022-06-25: qty 100

## 2022-06-25 MED ORDER — LACTATED RINGERS IV SOLN: INTRAVENOUS | Status: DC

## 2022-06-25 MED ORDER — DEXMEDETOMIDINE HCL IN NACL 400 MCG/100ML IV SOLN
0.4000 ug/kg/h | INTRAVENOUS | Status: DC
  Administered 2022-06-25: 1.001 ug/kg/h via INTRAVENOUS
  Administered 2022-06-25: 0.8 ug/kg/h via INTRAVENOUS
  Administered 2022-06-25: 0.4 ug/kg/h via INTRAVENOUS
  Filled 2022-06-25 (×2): qty 100

## 2022-06-25 MED ORDER — POLYETHYLENE GLYCOL 3350 17 G PO PACK: 17.0000 g | PACK | Freq: Every day | ORAL | Status: DC | PRN

## 2022-06-25 MED ORDER — LACTATED RINGERS IV BOLUS
1000.0000 mL | Freq: Once | INTRAVENOUS | Status: AC
  Administered 2022-06-25: 1000 mL via INTRAVENOUS

## 2022-06-25 NOTE — Progress Notes (Signed)
eLink Physician-Brief Progress Note Patient Name: Lance Franklin DOB: 1981-07-02 MRN: [id number]   Date of Service  06/25/2022  HPI/Events of Note  Oliguria - Still with minimal urine output.  eICU Interventions  Plan: Bolus with LR 1 liter IV over 1 hour now.      Intervention Category Major Interventions: Other:  Marye Eagen Dennard Nip 06/25/2022, 5:11 AM

## 2022-06-25 NOTE — Procedures (Signed)
Patient Name: Lance Franklin  MRN: [id number]  Epilepsy Attending: Charlsie Quest  Referring Physician/Provider: Caryl Pina, MD  Duration: 06/24/2022 2112 to 06/25/2022 2112   Patient history: 41 year old male with altered mental status and status epilepticus.  EEG to evaluate for seizure   Level of alertness: comatose   AEDs during EEG study: Keppra, propofol   Technical aspects: This EEG study was done with scalp electrodes positioned according to the 10-20 International system of electrode placement. Electrical activity was acquired at a sampling rate of 500Hz  and reviewed with a high frequency filter of 70Hz  and a low frequency filter of 1Hz . EEG data were recorded continuously and digitally stored.    Description: EEG initially showed continuous generalized 3 to 5 Hz theta and delta slowing admixed with an excessive amount of 15 to 18 Hz beta activity distributed symmetrically and diffusely.  As sedation was weaned, EEG showed posterior dominant rhythm of 8 Hz activity of moderate voltage (25-35 uV) seen predominantly in posterior head regions, symmetric and reactive to eye opening and eye closing. Sleep was characterized by vertex waves, sleep spindles (12 to 14 Hz), maximal frontocentral region. Hyperventilation and photic stimulation were not performed.      ABNORMALITY - Continuous slow, generalized - Excessive beta, generalized   IMPRESSION: This study was initially suggestive of severe diffuse encephalopathy, nonspecific etiology but likely related to sedation. As sedation was weaned, EEG improved and was within normal limits. No seizures or epileptiform discharges were seen throughout the recording.   [name] 

## 2022-06-25 NOTE — Progress Notes (Signed)
EEG maintenance performed. No skin breakdown.  

## 2022-06-25 NOTE — Progress Notes (Signed)
eLink Physician-Brief Progress Note Patient Name: Lance Franklin DOB: 1981/01/17 MRN: [id number]   Date of Service  06/25/2022  HPI/Events of Note  Patient extubated earlier today. Patient passed bedside swallow evaluation. Nursing request for diet order.   eICU Interventions  Plan: Clear liquid diet as tolerated.      Intervention Category Major Interventions: Other:  Lenell Antu 06/25/2022, 9:06 PM

## 2022-06-25 NOTE — Progress Notes (Addendum)
Subjective: No seizures overnight.  ROS: Unable to obtain due to poor mental status  Examination  Vital signs in last 24 hours: Temp:  [95.7 F (35.4 C)-98.4 F (36.9 C)] 96.4 F (35.8 C) (07/26 0900) Pulse Rate:  [50-90] 60 (07/26 0800) Resp:  [18-22] 20 (07/26 0900) BP: (87-126)/(60-89) 107/74 (07/26 0900) SpO2:  [88 %-100 %] 88 % (07/26 0800) FiO2 (%):  [40 %] 40 % (07/26 0733)  General: lying in bed, extubated Neuro: awake, able to tell me his name,follows simple 1 step commands, PERRLA, EOMI, spontaneously moving all 4 extremities with antigravity strength  Basic Metabolic Panel: Recent Labs  Lab 06/23/22 1950 06/23/22 1959 06/24/22 0421 06/25/22 0444  NA 146* 146* 143 142  K 5.2* 4.7 3.9 4.2  CL 111  --  113* 117*  CO2 20*  --  19* 20*  GLUCOSE 120*  --  106* 79  BUN 31*  --  34* 23*  CREATININE 4.07*  --  2.85* 1.69*  CALCIUM 9.7  --  8.5* 8.0*  MG  --   --  2.1 2.3  PHOS  --   --  3.1 2.9    CBC: Recent Labs  Lab 06/23/22 1950 06/23/22 1959 06/24/22 0421 06/25/22 0444  WBC 33.6*  --  17.0* 15.2*  NEUTROABS 27.5*  --   --   --   HGB 15.8 16.3 13.2 12.1*  HCT 46.7 48.0 38.4* 35.4*  MCV 100.0  --  98.2 100.6*  PLT 323  --  169 149*     Coagulation Studies: Recent Labs    06/23/22 1950  LABPROT 14.3  INR 1.1    Imaging MRI brain without contrast 06/24/2022: No acute abnormality.  ASSESSMENT AND PLAN:  41 year old male who presented with new onset seizure activity in the setting of cocaine use   New onset seizure Cocaine use disorder Rhabdomyolysis Acute encephalopathy -No seizures overnight   Recommendations -Plan to wean propofol at 10 mcg/h to stop  -Continue EEG while on sedation, will likely discontinue later today  -Continue Keppra 750 mg twice daily -Continue seizure precautions -Monitor for withdrawal after sedation is weaned off -Will need substance abuse counseling when more awake -As needed IV Ativan 2 mg for clinical  seizure-like activity -Management of rest of comorbidities per primary team -Discussed plan with Dr. Merrily Pew   CRITICAL CARE Performed by: Charlsie Quest     Total critical care time: 32 minutes   Critical care time was exclusive of separately billable procedures and treating other patients.   Critical care was necessary to treat or prevent imminent or life-threatening deterioration.   Critical care was time spent personally by me on the following activities: development of treatment plan with patient and/or surrogate as well as nursing, discussions with consultants, evaluation of patient's response to treatment, examination of patient, obtaining history from patient or surrogate, ordering and performing treatments and interventions, ordering and review of laboratory studies, ordering and review of radiographic studies, pulse oximetry and re-evaluation of patient's condition.  Lindie Spruce Epilepsy [facility] For questions after 5pm please refer to AMION to reach the Neurologist on call

## 2022-06-25 NOTE — Procedures (Signed)
Extubation Procedure Note  Patient Details:   Name: Lance Franklin DOB: 1980/12/13 MRN: [id number]   Airway Documentation:    Vent end date: 06/25/22 Vent end time: 1232   Evaluation  O2 sats: stable throughout Complications: No apparent complications Patient did tolerate procedure well. Bilateral Breath Sounds: Clear   Yes  RT extubated patient to 4L [place] per MD order with RN at bedside. Positive cuff leak noted. Patient tolerated well. No stridor noted at this time. RT will continue to monitor as needed.   Jaquelyn Bitter 06/25/2022, 12:35 PM

## 2022-06-25 NOTE — Progress Notes (Signed)
NAME:  Lance Franklin, MRN:  [id number], DOB:  1980/12/04, LOS: 2 ADMISSION DATE:  06/23/2022, CONSULTATION DATE:  06/23/22 REFERRING MD:  Glynn Octave, MD CHIEF COMPLAINT:  Seizure, AMS   History of Present Illness:  41 year old male with history polysubstance abuse found down 7/24 by co-workers. EMS arrived and witnessed seizure like activity followed by emesis x 1. He was given Versed 5 mg for tonic clonic seizures which continued on arrival to the ED with rhythmic movements of upper and lower extremities. Ativan 2mg  x 2 was given with cessation of seizure. He was intubated for airway protection and started on propofol. Loaded with Keppra 1500 mg x 1. Neurology consulted. PCCM for admission.  Pertinent  Medical History  Crack abuse  Significant Hospital Events: Including procedures, antibiotic start and stop dates in addition to other pertinent events   7/24 Admitted 7/24 EEG no seizure activity 7/25 remains on cEEG, MRI brain neg, CK rising, sCr improving  Interim History / Subjective:  Remains oliguric, s/p 2L boluses overnight CK up 9.7> 16.2 but sCr improving Remains on cEEG MRI brain yest > no acute findings  Objective   Blood pressure 93/64, pulse (!) 53, temperature (!) 96.1 F (35.6 C), resp. rate 20, height 5\' 8"  (1.727 m), weight 77.1 kg, SpO2 100 %.    Vent Mode: PRVC FiO2 (%):  [40 %] 40 % Set Rate:  [20 bmp] 20 bmp Vt Set:  [540 mL] 540 mL PEEP:  [5 cmH20] 5 cmH20 Plateau Pressure:  [14 cmH20-15 cmH20] 14 cmH20   Intake/Output Summary (Last 24 hours) at 06/25/2022 0736 Last data filed at 06/25/2022 0400 Gross per 24 hour  Intake 1916.27 ml  Output 610 ml  Net 1306.27 ml   Filed Weights   06/23/22 2040  Weight: 77.1 kg   Physical Exam: Propofol 40 Fentanyl 100 General:  critically ill male sedated/ intubated on MV HEENT: MM pink/moist, ETT at 23/lip, OGT, pupils 3/reactive, anicteric   Neuro:  sedated but withdrawals to noxious stimuli in all  extremities, remains in BUE wrist restraints  CV: rr, SB, no murmur PULM:  non labored, CTA, no secretions GI: soft, bs+, ND, foley- tea colored Extremities: warm/dry, no peripheral edema  Skin: no rashes  Afebrile UOP 35 ml/ 12hrs Net +5.1L  Labs reviewed, CK 9.7> 16.2K, sCr 2.85> 1.69, K 4.2, bicarb 19> 20, lactic remains normal, plts 149, WBC 17> 15 CXR> ett 5 above carina, non acute  Resolved Hospital Problem list   N/A  Assessment & Plan:   Acute toxic metabolic encephalopathy  Status epilepticus/Seizure, suspect secondary to recent drug use Substance abuse   Afebrile, low suspicion for infection. History and absence of clinical signs for meningitis UDS POS benzos, cocaine, THC  Plan Appreciate Neurology's assistance MRI brain non acute Respiratory support as below/ above cEEG per Neurology, likely will be stopped today  AEDs per neurology  Maintain neuro protective measures; goal for eurothermia, euglycemia, eunatermia, normoxia, and PCO2 goal of 35-40 Nutrition and bowel regiment > start TF if not extubated today  Seizure precautions  Aspirations precautions  Monitor clinically off abx  AKI with oliguria Rhabdomyolysis Hyperkalemia - resolved Continue LR at 150 ml/ hr, avoid further NS as CL rising Suspect CK is peaking today vs tomorrow.  sCr continues to improve, bicarb/ K stable, repeat lactic wnl, recheck phos.  Will hold off on bicarb for now Continue foley> monitor UOP closely Serial renal indices Minimize/avoid nephrotoxic agents  Acute hypoxemic respiratory failure  secondary to above Continue PRVC, 4-8cc/kg IBW with goal Pplat <30 and DP<15  VAP prevention protocol/ PPI PAD protocol for sedation> propofol/ fentanyl, likely to start weaning today  FiO2 for goal SpO2 >92%  Hopeful for SAT & SBT today after cleared by neurology   Diarrhea - in setting of seizures. Resolved.  GI panel and C.diff neg Prn bowel regimen   Best Practice (right click and  "Reselect all SmartList Selections" daily)   Diet/type: NPO; start TF if not extubated today  DVT prophylaxis: prophylactic heparin   GI prophylaxis: PPI Lines: N/A Foley:  Yes, and it is still needed Code Status:  full code Last date of multidisciplinary goals of care discussion []  Pending 7/26  Labs   CBC: Recent Labs  Lab 06/23/22 1950 06/23/22 1959 06/24/22 0421 06/25/22 0444  WBC 33.6*  --  17.0* 15.2*  NEUTROABS 27.5*  --   --   --   HGB 15.8 16.3 13.2 12.1*  HCT 46.7 48.0 38.4* 35.4*  MCV 100.0  --  98.2 100.6*  PLT 323  --  169 149*    Basic Metabolic Panel: Recent Labs  Lab 06/23/22 1950 06/23/22 1959 06/24/22 0421  NA 146* 146* 143  K 5.2* 4.7 3.9  CL 111  --  113*  CO2 20*  --  19*  GLUCOSE 120*  --  106*  BUN 31*  --  34*  CREATININE 4.07*  --  2.85*  CALCIUM 9.7  --  8.5*  MG  --   --  2.1  PHOS  --   --  3.1   GFR: Estimated Creatinine Clearance: 34 mL/min (A) (by C-G formula based on SCr of 2.85 mg/dL (H)). Recent Labs  Lab 06/23/22 1950 06/23/22 2000 06/24/22 0421 06/24/22 0919 06/24/22 1401 06/24/22 2215 06/25/22 0444  WBC 33.6*  --  17.0*  --   --   --  15.2*  LATICACIDVEN  --    < > 1.2 1.1 1.4 1.1  --    < > = values in this interval not displayed.    Liver Function Tests: Recent Labs  Lab 06/23/22 1950  AST 77*  ALT 37  ALKPHOS 66  BILITOT 0.9  PROT 8.0  ALBUMIN 5.1*   No results for input(s): "LIPASE", "AMYLASE" in the last 168 hours. No results for input(s): "AMMONIA" in the last 168 hours.  ABG    Component Value Date/Time   PHART 7.287 (L) 06/23/2022 1959   PCO2ART 53.8 (H) 06/23/2022 1959   PO2ART 451 (H) 06/23/2022 1959   HCO3 25.7 06/23/2022 1959   TCO2 27 06/23/2022 1959   ACIDBASEDEF 2.0 06/23/2022 1959   O2SAT 100 06/23/2022 1959     Coagulation Profile: Recent Labs  Lab 06/23/22 1950  INR 1.1    Cardiac Enzymes: Recent Labs  Lab 06/23/22 1950 06/24/22 0421  CKTOTAL 2,920* 9,737*     HbA1C: No results found for: "HGBA1C"  CBG: Recent Labs  Lab 06/23/22 2130  GLUCAP 74     Critical care time: 36 min     The patient is critically ill with multiple organ systems failure and requires high complexity decision making for assessment and support, frequent evaluation and titration of therapies, application of advanced monitoring technologies and extensive interpretation of multiple databases.    [id number], ACNP [facility] 06/25/2022, 7:36 AM  See Amion for pager If no response to pager, please call PCCM consult pager After 7:00 pm call Elink

## 2022-06-25 NOTE — Evaluation (Signed)
Physical Therapy Evaluation Patient Details Name: Lance Franklin MRN: [id number] DOB: 1981-09-16 Today's Date: 06/25/2022  History of Present Illness  41 year old male with history polysubstance abuse found down by co-workers. EMS arrived and witnessed seizure like activity followed by emesis x 1.  Patient given meds for seizures and intubated.  Was extubated 06/25/22.  Clinical Impression  Patient seen for limited evaluation in bed due to N&V during session.  Seems strength is good and some slowed coordination and limited cognition post seizure.  PT will continue to follow, but likely to progress and hopeful he will not need follow up PT.  Sister in the room and supportive.        Recommendations for follow up therapy are one component of a multi-disciplinary discharge planning process, led by the attending physician.  Recommendations may be updated based on patient status, additional functional criteria and insurance authorization.  Follow Up Recommendations No PT follow up      Assistance Recommended at Discharge Intermittent Supervision/Assistance  Patient can return home with the following  Assist for transportation;Help with stairs or ramp for entrance;Direct supervision/assist for medications management;Assistance with cooking/housework;A little help with walking and/or transfers    Equipment Recommendations Other (comment) (TBA)  Recommendations for Other Services       Functional Status Assessment Patient has had a recent decline in their functional status and demonstrates the ability to make significant improvements in function in a reasonable and predictable amount of time.     Precautions / Restrictions Precautions Precautions: Fall Precaution Comments: seizure      Mobility  Bed Mobility               General bed mobility comments: deferred due to vomiting during session    Transfers                        Ambulation/Gait                   Stairs            Wheelchair Mobility    Modified Rankin (Stroke Patients Only)       Balance                                             Pertinent Vitals/Pain Pain Assessment Pain Assessment: No/denies pain    Home Living Family/patient expects to be discharged to:: Private residence   Available Help at Discharge: Family Type of Home: Mobile home Home Access: Stairs to enter Entrance Stairs-Rails: Right Entrance Stairs-Number of Steps: 4-5   Home Layout: One level Home Equipment: Agricultural consultant (2 wheels);Wheelchair - manual;BSC/3in1;Shower seat Additional Comments: all from his dad who is a Ambulance person Prior Level of Function : Independent/Modified Independent             Mobility Comments: working inconsistent jobs       Higher education careers adviser   Dominant Hand: Right    Extremity/Trunk Assessment   Upper Extremity Assessment Upper Extremity Assessment: Overall WFL for tasks assessed    Lower Extremity Assessment Lower Extremity Assessment: Overall WFL for tasks assessed       Communication   Communication: No difficulties  Cognition Arousal/Alertness: Awake/alert Behavior During Therapy: WFL for tasks assessed/performed Overall Cognitive Status: Impaired/Different from baseline Area of Impairment: Orientation, Memory, Following commands,  Safety/judgement, Attention                 Orientation Level: Disoriented to, Time, Situation Current Attention Level: Sustained Memory: Decreased short-term memory Following Commands: Follows one step commands consistently, Follows one step commands with increased time Safety/Judgement: Decreased awareness of deficits     General Comments: difficulty recalling reason he is here, stated it was Monday, 24th of August        General Comments General comments (skin integrity, edema, etc.): noted area on palm of L hand and extension onto ring finger of large area of  skin sloughing and moist area underneath; pt reports working with chemicals recently    Exercises     Assessment/Plan    PT Assessment Patient needs continued PT services  PT Problem List Decreased activity tolerance;Decreased safety awareness;Decreased mobility;Decreased cognition       PT Treatment Interventions DME instruction;Therapeutic exercise;Gait training;Balance training;Neuromuscular re-education;Stair training;Functional mobility training;Therapeutic activities;Patient/family education    PT Goals (Current goals can be found in the Care Plan section)  Acute Rehab PT Goals Patient Stated Goal: to go home PT Goal Formulation: With patient/family Time For Goal Achievement: 07/09/22 Potential to Achieve Goals: Good    Frequency Min 3X/week     Co-evaluation               AM-PAC PT "6 Clicks" Mobility  Outcome Measure Help needed turning from your back to your side while in a flat bed without using bedrails?: Total Help needed moving from lying on your back to sitting on the side of a flat bed without using bedrails?: Total Help needed moving to and from a bed to a chair (including a wheelchair)?: Total Help needed standing up from a chair using your arms (e.g., wheelchair or bedside chair)?: Total Help needed to walk in hospital room?: Total Help needed climbing 3-5 steps with a railing? : Total 6 Click Score: 6    End of Session   Activity Tolerance: Treatment limited secondary to medical complications (Comment) (vomited during session) Patient left: in bed;with family/visitor present   PT Visit Diagnosis: Other abnormalities of gait and mobility (R26.89);Other symptoms and signs involving the nervous system (R29.898)    Time: 3976-7341 PT Time Calculation (min) (ACUTE ONLY): 17 min   Charges:   PT Evaluation $PT Eval Moderate Complexity: 1 Mod          Cyndi Shaakira Borrero, PT Acute Rehabilitation Services Office:[phone] 06/25/2022   Elray Mcgregor 06/25/2022, 4:47 PM

## 2022-06-26 LAB — CBC
HCT: 36.4 % — ABNORMAL LOW (ref 39.0–52.0)
Hemoglobin: 12.4 g/dL — ABNORMAL LOW (ref 13.0–17.0)
MCH: 33.8 pg (ref 26.0–34.0)
MCHC: 34.1 g/dL (ref 30.0–36.0)
MCV: 99.2 fL (ref 80.0–100.0)
Platelets: 147 10*3/uL — ABNORMAL LOW (ref 150–400)
RBC: 3.67 MIL/uL — ABNORMAL LOW (ref 4.22–5.81)
RDW: 13.7 % (ref 11.5–15.5)
WBC: 15.5 10*3/uL — ABNORMAL HIGH (ref 4.0–10.5)
nRBC: 0 % (ref 0.0–0.2)

## 2022-06-26 LAB — RENAL FUNCTION PANEL
Albumin: 2.9 g/dL — ABNORMAL LOW (ref 3.5–5.0)
Anion gap: 8 (ref 5–15)
BUN: 15 mg/dL (ref 6–20)
CO2: 18 mmol/L — ABNORMAL LOW (ref 22–32)
Calcium: 8.2 mg/dL — ABNORMAL LOW (ref 8.9–10.3)
Chloride: 112 mmol/L — ABNORMAL HIGH (ref 98–111)
Creatinine, Ser: 1.39 mg/dL — ABNORMAL HIGH (ref 0.61–1.24)
GFR, Estimated: >60 mL/min (ref >60)
Glucose, Bld: 85 mg/dL (ref 70–99)
Phosphorus: 3.7 mg/dL (ref 2.5–4.6)
Potassium: 4.2 mmol/L (ref 3.5–5.1)
Sodium: 138 mmol/L (ref 135–145)

## 2022-06-26 LAB — MAGNESIUM: Magnesium: 2 mg/dL (ref 1.7–2.4)

## 2022-06-26 LAB — CK: Total CK: 12494 U/L — ABNORMAL HIGH (ref 49–397)

## 2022-06-26 MED ORDER — ADULT MULTIVITAMIN LIQUID CH
15.0000 mL | Freq: Every day | ORAL | Status: DC
  Filled 2022-06-26: qty 15

## 2022-06-26 MED ORDER — LEVETIRACETAM 750 MG PO TABS
750.0000 mg | ORAL_TABLET | Freq: Two times a day (BID) | ORAL | Status: DC
Start: 2022-06-26 — End: 2022-06-26
  Administered 2022-06-26: 750 mg
  Filled 2022-06-26: qty 1

## 2022-06-26 MED ORDER — THIAMINE HCL 100 MG PO TABS
100.0000 mg | ORAL_TABLET | Freq: Every day | ORAL | Status: DC
  Administered 2022-06-26 – 2022-06-27 (×2): 100 mg via ORAL
  Filled 2022-06-26 (×2): qty 1

## 2022-06-26 MED ORDER — LORAZEPAM 1 MG PO TABS: 1.0000 mg | ORAL_TABLET | ORAL | Status: DC | PRN

## 2022-06-26 MED ORDER — ENOXAPARIN SODIUM 40 MG/0.4ML IJ SOSY
40.0000 mg | PREFILLED_SYRINGE | INTRAMUSCULAR | Status: DC
  Administered 2022-06-26 – 2022-06-27 (×2): 40 mg via SUBCUTANEOUS
  Filled 2022-06-26 (×2): qty 0.4

## 2022-06-26 MED ORDER — ADULT MULTIVITAMIN W/MINERALS CH
1.0000 | ORAL_TABLET | Freq: Every day | ORAL | Status: DC
  Administered 2022-06-26 – 2022-06-27 (×2): 1 via ORAL
  Filled 2022-06-26 (×2): qty 1

## 2022-06-26 MED ORDER — SILVER SULFADIAZINE 1 % EX CREA
TOPICAL_CREAM | Freq: Every day | CUTANEOUS | Status: DC
  Filled 2022-06-26: qty 85

## 2022-06-26 MED ORDER — LEVETIRACETAM 750 MG PO TABS
750.0000 mg | ORAL_TABLET | Freq: Two times a day (BID) | ORAL | Status: DC
  Administered 2022-06-26 – 2022-06-27 (×2): 750 mg via ORAL
  Filled 2022-06-26 (×2): qty 1

## 2022-06-26 MED ORDER — POLYETHYLENE GLYCOL 3350 17 G PO PACK: 17.0000 g | PACK | Freq: Every day | ORAL | Status: DC | PRN

## 2022-06-26 MED ORDER — LORAZEPAM 2 MG/ML IJ SOLN: 1.0000 mg | INTRAMUSCULAR | Status: DC | PRN

## 2022-06-26 MED ORDER — LORAZEPAM 2 MG/ML IJ SOLN
2.0000 mg | INTRAMUSCULAR | Status: DC | PRN
Start: 2022-06-26 — End: 2022-06-27

## 2022-06-26 MED ORDER — FOLIC ACID 1 MG PO TABS
1.0000 mg | ORAL_TABLET | Freq: Every day | ORAL | Status: DC
  Administered 2022-06-26 – 2022-06-27 (×2): 1 mg via ORAL
  Filled 2022-06-26 (×2): qty 1

## 2022-06-26 MED ORDER — PANTOPRAZOLE 2 MG/ML SUSPENSION
40.0000 mg | Freq: Every day | ORAL | Status: DC
Start: 2022-06-26 — End: 2022-06-26

## 2022-06-26 NOTE — Progress Notes (Signed)
   PCCM Interval Note  - patient reports blister to left hand after grabbing a pipe on Monday at work.     Blister to palm of hand was intact this morning but since broke. Central area with pain sensation intact.  Several small blisters noted to thumb, middle and ring finger.  No foul drainage.    Plans Clean daily with warm soapy water, followed by SSD cream, nonstick dressing, then kerlix.   Will update tetanus if needed OT eval       Posey Boyer, ACNP [facility] 06/26/2022, 4:55 PM  See Amion for pager If no response to pager, please call PCCM consult pager After 7:00 pm call Elink

## 2022-06-26 NOTE — Procedures (Addendum)
Patient Name: ALASTAIR HENNES  MRN: [id number]  Epilepsy Attending: Charlsie Quest  Referring Physician/Provider: Caryl Pina, MD  Duration: 06/25/2022 2112 to 06/27/19 1110   Patient history: 41 year old male with altered mental status and status epilepticus.  EEG to evaluate for seizure   Level of alertness: awake, asleep   AEDs during EEG study: Keppra   Technical aspects: This EEG study was done with scalp electrodes positioned according to the 10-20 International system of electrode placement. Electrical activity was acquired at a sampling rate of 500Hz  and reviewed with a high frequency filter of 70Hz  and a low frequency filter of 1Hz . EEG data were recorded continuously and digitally stored.    Description: EEG showed posterior dominant rhythm of 8 Hz activity of moderate voltage (25-35 uV) seen predominantly in posterior head regions, symmetric and reactive to eye opening and eye closing. Sleep was characterized by vertex waves, sleep spindles (12 to 14 Hz), maximal frontocentral region. Hyperventilation and photic stimulation were not performed.     IMPRESSION: This study is within normal limits. No seizures or epileptiform discharges were seen throughout the recording.   Gwendolen Hewlett 

## 2022-06-26 NOTE — Progress Notes (Signed)
vLTM maintenance   All impedances below 10kohms.  No skin breakdown noted at all skin sites 

## 2022-06-26 NOTE — Progress Notes (Signed)
NAME:  Lance Franklin, MRN:  [id number], DOB:  1981-10-10, LOS: 3 ADMISSION DATE:  06/23/2022, CONSULTATION DATE:  06/23/22 REFERRING MD:  Glynn Octave, MD CHIEF COMPLAINT:  Seizure, AMS   History of Present Illness:  41 year old male with history polysubstance abuse found down 7/24 by co-workers. EMS arrived and witnessed seizure like activity followed by emesis x 1. He was given Versed 5 mg for tonic clonic seizures which continued on arrival to the ED with rhythmic movements of upper and lower extremities. Ativan 2mg  x 2 was given with cessation of seizure. He was intubated for airway protection and started on propofol. Loaded with Keppra 1500 mg x 1. Neurology consulted. PCCM for admission.  Pertinent  Medical History  Crack abuse  Significant Hospital Events: Including procedures, antibiotic start and stop dates in addition to other pertinent events   7/24 Admitted 7/24 EEG no seizure activity 7/25 remains on cEEG, MRI brain neg, CK rising, sCr improving 7/26 extubated, remains on precedex/ cEEG, CK still rising, sCr improving  Interim History / Subjective:  Extubated 7/26 Off precedex ~ 5am No complaints except wanting food and states his wife has arranged pick up for him this afternoon to leave> hence discussion why he is not ready for discharge today and patient agreeable to stay  Had low UOP> some resistance with foley flushing yesterday, since d/c'd and voided over 2.6L sCr continues to improve CK downtrending  Objective   Blood pressure 111/71, pulse 82, temperature 98.1 F (36.7 C), temperature source Oral, resp. rate (!) 25, height 5\' 8"  (1.727 m), weight 77.1 kg, SpO2 93 %.    Vent Mode: CPAP;PSV FiO2 (%):  [40 %] 40 % Set Rate:  [20 bmp] 20 bmp Vt Set:  [540 mL] 540 mL PEEP:  [5 cmH20] 5 cmH20 Pressure Support:  [5 cmH20] 5 cmH20 Plateau Pressure:  [14 cmH20] 14 cmH20   Intake/Output Summary (Last 24 hours) at 06/26/2022 Last data filed at 06/26/2022  0800 Gross per 24 hour  Intake 4316.55 ml  Output 690 ml  Net 3626.55 ml   Filed Weights   06/23/22 2040  Weight: 77.1 kg   Physical Exam: General:  adult male lying in bed in NAD HEENT: MM pink/moist, poor dentition, pupils 3/reactive, cEEG leads in place Neuro: alert, oriented x3, MAE CV: rr, NSR PULM:  non labored, CTA, room air GI: soft, bs+, NT, voids Extremities: warm/dry, no edema, denies any muscle tenderness/ pain  Labs> bicarb 20> 18, sCr 1.69> 1.39, CK 16k> 12k,  WBC 15.5, plts 149> 147  Resolved Hospital Problem list   N/A  Assessment & Plan:   Acute toxic metabolic encephalopathy  Status epilepticus/Seizure, suspect secondary to recent drug use Substance abuse   Afebrile, low suspicion for infection. History and absence of clinical signs for meningitis UDS POS benzos, cocaine, THC  MRI brain neg Plan Appreciate Neurology's assistance cEEG per Neurology> remains negative for seizures, stopping today   AEDs per neurology  Maintain neuro protective measures; goal for eurothermia, euglycemia, eunatermia, normoxia, and PCO2 goal of 35-40 Seizure precautions  Aspirations precautions  Monitor clinically off abx Denies any recent substance abuse except for THC despite UDS.  Reports drinks around 2 beers daily most days, never stopped before.  Will CIWA and thiamine/ MVI/ folate and monitor for ETOH withdrawal Polysubstance abuse counseling ongoing   AKI with oliguria Rhabdomyolysis Hyperkalemia - resolved Stop IV fluids now taking POs sCr continues to improve CK peaked 7/26, now downtrending; Denies  any muscle weakness/ pain Foley d/c 7/26 Serial renal indices Minimize/avoid nephrotoxic agents  Acute hypoxemic respiratory failure secondary to above - extubated 7/26 - aggressive pulm hygiene> IS, mobilize  - currently on 2L, likely can wean to RA - advance diet as tolerated  Diarrhea - in setting of seizures. Resolved.  - resolved   Best Practice  (right click and "Reselect all SmartList Selections" daily)   Diet/type: Regular consistency (see orders) DVT prophylaxis: LMWH  GI prophylaxis: N/A and PPI Lines: N/A Foley:  N/A Code Status:  full code Last date of multidisciplinary goals of care discussion []   Patient updated on plan of care.  Will be transferred to telemetry and care to transferred to [facility] as of 7/28  Labs   CBC: Recent Labs  Lab 06/23/22 1950 06/23/22 1959 06/24/22 0421 06/25/22 0444 06/26/22 0239  WBC 33.6*  --  17.0* 15.2* 15.5*  NEUTROABS 27.5*  --   --   --   --   HGB 15.8 16.3 13.2 12.1* 12.4*  HCT 46.7 48.0 38.4* 35.4* 36.4*  MCV 100.0  --  98.2 100.6* 99.2  PLT 323  --  169 149* 147*    Basic Metabolic Panel: Recent Labs  Lab 06/23/22 1950 06/23/22 1959 06/24/22 0421 06/25/22 0444 06/26/22 0239  NA 146* 146* 143 142 138  K 5.2* 4.7 3.9 4.2 4.2  CL 111  --  113* 117* 112*  CO2 20*  --  19* 20* 18*  GLUCOSE 120*  --  106* 79 85  BUN 31*  --  34* 23* 15  CREATININE 4.07*  --  2.85* 1.69* 1.39*  CALCIUM 9.7  --  8.5* 8.0* 8.2*  MG  --   --  2.1 2.3 2.0  PHOS  --   --  3.1 2.9 3.7   GFR: Estimated Creatinine Clearance: 69.7 mL/min (A) (by C-G formula based on SCr of 1.39 mg/dL (H)). Recent Labs  Lab 06/23/22 1950 06/23/22 2000 06/24/22 0421 06/24/22 0919 06/24/22 1401 06/24/22 2215 06/25/22 0444 06/26/22 0239  WBC 33.6*  --  17.0*  --   --   --  15.2* 15.5*  LATICACIDVEN  --    < > 1.2 1.1 1.4 1.1  --   --    < > = values in this interval not displayed.    Liver Function Tests: Recent Labs  Lab 06/23/22 1950 06/26/22 0239  AST 77*  --   ALT 37  --   ALKPHOS 66  --   BILITOT 0.9  --   PROT 8.0  --   ALBUMIN 5.1* 2.9*   No results for input(s): "LIPASE", "AMYLASE" in the last 168 hours. No results for input(s): "AMMONIA" in the last 168 hours.  ABG    Component Value Date/Time   PHART 7.287 (L) 06/23/2022 1959   PCO2ART 53.8 (H) 06/23/2022 1959   PO2ART 451 (H)  06/23/2022 1959   HCO3 25.7 06/23/2022 1959   TCO2 27 06/23/2022 1959   ACIDBASEDEF 2.0 06/23/2022 1959   O2SAT 100 06/23/2022 1959     Coagulation Profile: Recent Labs  Lab 06/23/22 1950  INR 1.1    Cardiac Enzymes: Recent Labs  Lab 06/23/22 1950 06/24/22 0421 06/25/22 0444  CKTOTAL 2,920* 9,737* 16,206*    HbA1C: No results found for: "HGBA1C"  CBG: Recent Labs  Lab 06/23/22 2130  GLUCAP 74     Critical care time: 36 min     The patient is critically ill with multiple organ systems  failure and requires high complexity decision making for assessment and support, frequent evaluation and titration of therapies, application of advanced monitoring technologies and extensive interpretation of multiple databases.    Posey Boyer, ACNP [facility] 06/26/2022, 8:33 AM  See Amion for pager If no response to pager, please call PCCM consult pager After 7:00 pm call Elink

## 2022-06-26 NOTE — Progress Notes (Signed)
Physical Therapy Treatment Patient Details Name: Lance Franklin MRN: [id number] DOB: 12-25-1980 Today's Date: 06/26/2022   History of Present Illness 41 year old male with history polysubstance abuse found down by co-workers 06/23/22. EMS arrived and witnessed seizure like activity followed by emesis x 1.  Patient given meds for seizures and intubated.  Was extubated 06/25/22.    PT Comments    Pt with less nausea today, able to tolerate OOB mobility. Pt was able to perform all functional mobility, including taking steps at EOB without UE support, at a min guard assist level. He demonstrated some mild instability, but no overt LOB. Pt limited in further ambulation today by EEG lines. I anticipate pt will progress well, thus current recommendations remain appropriate. Will continue to follow acutely.     Recommendations for follow up therapy are one component of a multi-disciplinary discharge planning process, led by the attending physician.  Recommendations may be updated based on patient status, additional functional criteria and insurance authorization.  Follow Up Recommendations  No PT follow up     Assistance Recommended at Discharge Intermittent Supervision/Assistance  Patient can return home with the following Assist for transportation;Help with stairs or ramp for entrance;Direct supervision/assist for medications management;Assistance with cooking/housework   Equipment Recommendations  None recommended by PT    Recommendations for Other Services       Precautions / Restrictions Precautions Precautions: Fall Precaution Comments: seizure; EEG; flexiseal Restrictions Weight Bearing Restrictions: No     Mobility  Bed Mobility Overal bed mobility: Needs Assistance Bed Mobility: Supine to Sit, Sit to Supine     Supine to sit: Min guard, HOB elevated Sit to supine: Min guard, HOB elevated   General bed mobility comments: Min guard for safety and line management, no need for  physical assist though, HOB elevated.    Transfers Overall transfer level: Needs assistance Equipment used: None Transfers: Sit to/from Stand Sit to Stand: Min guard           General transfer comment: Min guard for safety coming to stand    Ambulation/Gait Ambulation/Gait assistance: Min guard Gait Distance (Feet): 20 Feet Assistive device: None Gait Pattern/deviations: Step-through pattern, Decreased stride length Gait velocity: reduced Gait velocity interpretation: <1.8 ft/sec, indicate of risk for recurrent falls   General Gait Details: Pt taking steps anterior, posterior, and lateral at EOB, limited by EEG line. Pt with mild instability and x1 sway but able to recover without assistance or UE support. Min guard for Futures trader    Modified Rankin (Stroke Patients Only) Modified Rankin (Stroke Patients Only) Pre-Morbid Rankin Score: No symptoms Modified Rankin: Moderate disability     Balance Overall balance assessment: Mild deficits observed, not formally tested                                          Cognition Arousal/Alertness: Awake/alert Behavior During Therapy: WFL for tasks assessed/performed Overall Cognitive Status: Impaired/Different from baseline Area of Impairment: Memory, Attention                   Current Attention Level: Selective Memory: Decreased short-term memory         General Comments: Pt with poor attention at times, needing redirecting, unsure if this is baseline though. Pt forgetful of what each line/lead is  for, re-educating pt several times during session.        Exercises      General Comments General comments (skin integrity, edema, etc.): Poor pleth but appeared VSS on 2L O2, BP stable but pt reporting mild nausea; noted open wound plantar aspect of L hand and blisters at L finger tips which pt reports is from a burn picking up an object at work the other  day      Pertinent Vitals/Pain Pain Assessment Pain Assessment: Faces Faces Pain Scale: No hurt Pain Intervention(s): Monitored during session    Home Living                          Prior Function            PT Goals (current goals can now be found in the care plan section) Acute Rehab PT Goals Patient Stated Goal: to get better PT Goal Formulation: With patient Time For Goal Achievement: 07/09/22 Potential to Achieve Goals: Good Progress towards PT goals: Progressing toward goals    Frequency    Min 3X/week      PT Plan Current plan remains appropriate;Equipment recommendations need to be updated    Co-evaluation              AM-PAC PT "6 Clicks" Mobility   Outcome Measure  Help needed turning from your back to your side while in a flat bed without using bedrails?: A Little Help needed moving from lying on your back to sitting on the side of a flat bed without using bedrails?: A Little Help needed moving to and from a bed to a chair (including a wheelchair)?: A Little Help needed standing up from a chair using your arms (e.g., wheelchair or bedside chair)?: A Little Help needed to walk in hospital room?: A Little Help needed climbing 3-5 steps with a railing? : A Little 6 Click Score: 18    End of Session Equipment Utilized During Treatment: Gait belt Activity Tolerance: Patient tolerated treatment well;Other (comment) (limited by EEG lines) Patient left: in bed;with call bell/phone within reach;Other (comment) (bed alarm would not set) Nurse Communication: Mobility status;Other (comment) (wound at L hand) PT Visit Diagnosis: Other abnormalities of gait and mobility (R26.89);Other symptoms and signs involving the nervous system (R29.898);Unsteadiness on feet (R26.81)     Time: 4193-7902 PT Time Calculation (min) (ACUTE ONLY): 23 min  Charges:  $Gait Training: 8-22 mins $Therapeutic Activity: 8-22 mins                     Lance Franklin,  PT, DPT Acute Rehabilitation Services  Office: [phone]    Lance Franklin 06/26/2022, 11:21 AM

## 2022-06-26 NOTE — Progress Notes (Signed)
Subjective: No acute events overnight.  Patient's sister at bedside.  ROS: negative except above  Examination  Vital signs in last 24 hours: Temp:  [97.7 F (36.5 C)-100.1 F (37.8 C)] 100.1 F (37.8 C) (07/27 0800) Pulse Rate:  [42-95] 94 (07/27 1000) Resp:  [10-25] 16 (07/27 1000) BP: (102-145)/(71-97) 126/91 (07/27 1000) SpO2:  [90 %-100 %] 94 % (07/27 1000) FiO2 (%):  [40 %] 40 % (07/26 1200)  General: laying in bed, NAD Neuro: AOx3, no aphasia, cranial nerves II through XII grossly intact, 5/5 in all extremities  Basic Metabolic Panel: Recent Labs  Lab 06/23/22 1950 06/23/22 1959 06/24/22 0421 06/25/22 0444 06/26/22 0239  NA 146* 146* 143 142 138  K 5.2* 4.7 3.9 4.2 4.2  CL 111  --  113* 117* 112*  CO2 20*  --  19* 20* 18*  GLUCOSE 120*  --  106* 79 85  BUN 31*  --  34* 23* 15  CREATININE 4.07*  --  2.85* 1.69* 1.39*  CALCIUM 9.7  --  8.5* 8.0* 8.2*  MG  --   --  2.1 2.3 2.0  PHOS  --   --  3.1 2.9 3.7    CBC: Recent Labs  Lab 06/23/22 1950 06/23/22 1959 06/24/22 0421 06/25/22 0444 06/26/22 0239  WBC 33.6*  --  17.0* 15.2* 15.5*  NEUTROABS 27.5*  --   --   --   --   HGB 15.8 16.3 13.2 12.1* 12.4*  HCT 46.7 48.0 38.4* 35.4* 36.4*  MCV 100.0  --  98.2 100.6* 99.2  PLT 323  --  169 149* 147*     Coagulation Studies: Recent Labs    06/23/22 1950  LABPROT 14.3  INR 1.1    Imaging No new brain imaging overnight  ASSESSMENT AND PLAN:  41 year old male who presented with new onset seizure activity in the setting of cocaine use   New onset seizure, provoked Cocaine use disorder Rhabdomyolysis Acute encephalopathy, resolved -Provoked seizure in the setting of cocaine use   Recommendations -Discontinue LTM EEG as no seizures overnight -Continue Keppra 750 mg twice daily -Discussed seizure precautions including do not drive -Counseled about substance use -Discussed medication side effects -Recommend follow-up with neurology in 3  months  Thank you for allowing [place] to persuade in the care of this patient.  Neurology will sign off.  Please call us with any further questions.  Seizure precautions: Per [facility] statutes, patients with seizures are not allowed to drive until they have been seizure-free for six months and cleared by a physician    Use caution when using heavy equipment or power tools. Avoid working on ladders or at heights. Take showers instead of baths. Ensure the water temperature is not too high on the home water heater. Do not go swimming alone. Do not lock yourself in a room alone (i.e. bathroom). When caring for infants or small children, sit down when holding, feeding, or changing them to minimize risk of injury to the child in the event you have a seizure. Maintain good sleep hygiene. Avoid alcohol.    If patient has another seizure, call 911 and bring them back to the ED if: A.  The seizure lasts longer than 5 minutes.      B.  The patient doesn't wake shortly after the seizure or has new problems such as difficulty seeing, speaking or moving following the seizure C.  The patient was injured during the seizure D.  The patient has  a temperature over 102 F (39C) E.  The patient vomited during the seizure and now is having trouble breathing    During the Seizure   - First, ensure adequate ventilation and place patients on the floor on their left side  Loosen clothing around the neck and ensure the airway is patent. If the patient is clenching the teeth, do not force the mouth open with any object as this can cause severe damage - Remove all items from the surrounding that can be hazardous. The patient may be oblivious to what's happening and may not even know what he or she is doing. If the patient is confused and wandering, either gently guide him/her away and block access to outside areas - Reassure the individual and be comforting - Call 911. In most cases, the seizure ends before EMS  arrives. However, there are cases when seizures may last over 3 to 5 minutes. Or the individual may have developed breathing difficulties or severe injuries. If a pregnant patient or a person with diabetes develops a seizure, it is prudent to call an ambulance. - Finally, if the patient does not regain full consciousness, then call EMS. Most patients will remain confused for about 45 to 90 minutes after a seizure, so you must use judgment in calling for help.   After the Seizure (Postictal Stage)   After a seizure, most patients experience confusion, fatigue, muscle pain and/or a headache. Thus, one should permit the individual to sleep. For the next few days, reassurance is essential. Being calm and helping reorient the person is also of importance.   Most seizures are painless and end spontaneously. Seizures are not harmful to others but can lead to complications such as stress on the lungs, brain and the heart. Individuals with prior lung problems may develop labored breathing and respiratory distress.    I have spent a total of  37  minutes with the patient reviewing hospital notes,  test results, labs and examining the patient as well as establishing an assessment and plan that was discussed personally with the patient.  > 50% of time was spent in direct patient care.   Lindie Spruce Epilepsy [facility] For questions after 5pm please refer to AMION to reach the Neurologist on call

## 2022-06-26 NOTE — Progress Notes (Signed)
LTM EEG discontinued - no skin breakdown at unhook.   

## 2022-06-26 NOTE — Progress Notes (Signed)
RN received report from Rudean Hitt RN  pt on unit Aox4 with VSS

## 2022-06-27 ENCOUNTER — Other Ambulatory Visit (HOSPITAL_COMMUNITY): Payer: Self-pay

## 2022-06-27 LAB — RENAL FUNCTION PANEL
Albumin: 3.2 g/dL — ABNORMAL LOW (ref 3.5–5.0)
Anion gap: 9 (ref 5–15)
BUN: 9 mg/dL (ref 6–20)
CO2: 26 mmol/L (ref 22–32)
Calcium: 8.5 mg/dL — ABNORMAL LOW (ref 8.9–10.3)
Chloride: 104 mmol/L (ref 98–111)
Creatinine, Ser: 1.22 mg/dL (ref 0.61–1.24)
GFR, Estimated: >60 mL/min (ref >60)
Glucose, Bld: 86 mg/dL (ref 70–99)
Phosphorus: 3.9 mg/dL (ref 2.5–4.6)
Potassium: 3.4 mmol/L — ABNORMAL LOW (ref 3.5–5.1)
Sodium: 139 mmol/L (ref 135–145)

## 2022-06-27 LAB — MAGNESIUM: Magnesium: 1.6 mg/dL — ABNORMAL LOW (ref 1.7–2.4)

## 2022-06-27 LAB — CULTURE, BLOOD (ROUTINE X 2)
Culture: NO GROWTH
Culture: NO GROWTH

## 2022-06-27 LAB — CK: Total CK: 9518 U/L — ABNORMAL HIGH (ref 49–397)

## 2022-06-27 MED ORDER — SILVER SULFADIAZINE 1 % EX CREA
TOPICAL_CREAM | Freq: Every day | CUTANEOUS | 0 refills | Status: AC
  Filled 2022-06-27: qty 50, 30d supply, fill #0

## 2022-06-27 MED ORDER — LEVETIRACETAM 750 MG PO TABS
750.0000 mg | ORAL_TABLET | Freq: Two times a day (BID) | ORAL | 0 refills | Status: AC
  Filled 2022-06-27: qty 60, 30d supply, fill #0

## 2022-06-27 MED ORDER — LACTATED RINGERS IV SOLN
INTRAVENOUS | Status: DC
Start: 2022-06-27 — End: 2022-06-27

## 2022-06-27 MED ORDER — ONDANSETRON HCL 4 MG/2ML IJ SOLN: 4.0000 mg | Freq: Four times a day (QID) | INTRAMUSCULAR | Status: DC | PRN

## 2022-06-27 MED ORDER — ONDANSETRON HCL 4 MG PO TABS: 4.0000 mg | ORAL_TABLET | Freq: Four times a day (QID) | ORAL | Status: DC | PRN

## 2022-06-27 MED ORDER — MAGNESIUM SULFATE 2 GM/50ML IV SOLN
2.0000 g | Freq: Once | INTRAVENOUS | Status: AC
Start: 2022-06-27 — End: 2022-06-27
  Administered 2022-06-27: 2 g via INTRAVENOUS
  Filled 2022-06-27: qty 50

## 2022-06-27 MED ORDER — LACTATED RINGERS IV BOLUS
500.0000 mL | Freq: Once | INTRAVENOUS | Status: AC
  Administered 2022-06-27: 500 mL via INTRAVENOUS

## 2022-06-27 MED ORDER — ADULT MULTIVITAMIN W/MINERALS CH
1.0000 | ORAL_TABLET | Freq: Every day | ORAL | 0 refills | Status: AC
  Filled 2022-06-27: qty 30, 30d supply, fill #0

## 2022-06-27 MED ORDER — ACETAMINOPHEN 650 MG RE SUPP: 650.0000 mg | Freq: Four times a day (QID) | RECTAL | Status: DC | PRN

## 2022-06-27 MED ORDER — POTASSIUM CHLORIDE CRYS ER 20 MEQ PO TBCR
40.0000 meq | EXTENDED_RELEASE_TABLET | ORAL | Status: AC
  Administered 2022-06-27 (×2): 40 meq via ORAL
  Filled 2022-06-27 (×2): qty 2

## 2022-06-27 MED ORDER — THIAMINE HCL 100 MG PO TABS
100.0000 mg | ORAL_TABLET | Freq: Every day | ORAL | 0 refills | Status: AC
  Filled 2022-06-27: qty 30, 30d supply, fill #0

## 2022-06-27 MED ORDER — FOLIC ACID 1 MG PO TABS
1.0000 mg | ORAL_TABLET | Freq: Every day | ORAL | 0 refills | Status: AC
  Filled 2022-06-27: qty 30, 30d supply, fill #0

## 2022-06-27 MED ORDER — ACETAMINOPHEN 325 MG PO TABS: 650.0000 mg | ORAL_TABLET | Freq: Four times a day (QID) | ORAL | Status: DC | PRN

## 2022-06-27 MED ORDER — TETANUS-DIPHTH-ACELL PERTUSSIS 5-2.5-18.5 LF-MCG/0.5 IM SUSY
0.5000 mL | PREFILLED_SYRINGE | Freq: Once | INTRAMUSCULAR | Status: AC
  Administered 2022-06-27: 0.5 mL via INTRAMUSCULAR
  Filled 2022-06-27: qty 0.5

## 2022-06-27 NOTE — Discharge Instructions (Signed)
You can follow these basic instruction for phased return to regular activities.  Phase Duration Activities  1 2 weeks - Return to activities of daily living - Avoid hot environments, maintain good sleep hygiene, plenty of rest - Drink plenty of water 2-3 liters/day - Daily monitoring of hydration status, muscle soreness, and swelling - Preferably Weekly Monitoring of creatinine kinase and serum creatinine by primary care physician - Progress to Phase 2 if no reoccurrence of symptoms and CK level remains below 5 times of normal value.  2 2 weeks - Daily monitoring of hydration status, muscle soreness, and swelling - Initiation of physical activity: Example. foam rolling, dynamic warm-up, stretching - If no reoccurrence of symptoms then progress to phase 3 - If the symptoms persists for 4 weeks seek medical attention  3 1 week - Daily monitoring of hydration status, muscle soreness, and swelling - Progression of physical activity: Example. body-weight resistance movements, resistance training with elastic band, core training, stationary bicycling, and stretching - If no reoccurrence of symptoms then progress to phase 4 - If the symptoms persists for 4 weeks seek medical attention  4 Onwards  - Daily monitoring of hydration status, muscle soreness, and swelling - Gradual and supervised return to regular activities preferably in 20% increment of estimated maximum capability

## 2022-06-27 NOTE — Progress Notes (Addendum)
Physical Therapy Treatment & Discharge Patient Details Name: Lance Franklin MRN: [id number] DOB: 14-Sep-1981 Today's Date: 06/27/2022   History of Present Illness 41 year old male with history polysubstance abuse found down by co-workers 06/23/22. EMS arrived and witnessed seizure like activity followed by emesis x 1.  Patient given meds for seizures and intubated.  Was extubated 06/25/22.    PT Comments    Pt is making great progress with mobility, ambulating at least ~600 ft without UE support, LOB, or assistance. Pt had x1 trunk sway initially, but recovered independently. Pt was also able to navigate stairs without UE support, LOB, or assistance with reciprocal step pattern. Pt scored 24/24 on the DGI, indicative of a safe ambulator without risk for falls. As pt is close to if not back to his baseline functionally and all PT goals met, education completed, and questions answered, PT will sign off.      Recommendations for follow up therapy are one component of a multi-disciplinary discharge planning process, led by the attending physician.  Recommendations may be updated based on patient status, additional functional criteria and insurance authorization.  Follow Up Recommendations  No PT follow up     Assistance Recommended at Discharge PRN  Patient can return home with the following Assist for transportation;Direct supervision/assist for medications management;Assistance with cooking/housework   Equipment Recommendations  None recommended by PT    Recommendations for Other Services       Precautions / Restrictions Precautions Precautions: Fall Precaution Comments: seizure Restrictions Weight Bearing Restrictions: No     Mobility  Bed Mobility               General bed mobility comments: Pt standing in bathroom upon arrival    Transfers                   General transfer comment: Pt standing in bathroom upon arrival    Ambulation/Gait Ambulation/Gait  assistance: Independent, Supervision Gait Distance (Feet): 600 Feet Assistive device: None Gait Pattern/deviations: WFL(Within Functional Limits) Gait velocity: WFL Gait velocity interpretation: >4.37 ft/sec, indicative of normal walking speed   General Gait Details: Pt with x1 lateral sway, but able to recover without assistance. Able to tolerate DGI challenges without LOB. Supervision initially, progressed to IND.   Stairs Stairs: Yes Stairs assistance: Min guard, Supervision Stair Management: No rails, Alternating pattern, Forwards Number of Stairs: 4 General stair comments: Ascends and descends without UE support, LOB, or assistance. Min guard > supervision for safety   Wheelchair Mobility    Modified Rankin (Stroke Patients Only) Modified Rankin (Stroke Patients Only) Pre-Morbid Rankin Score: No symptoms Modified Rankin: No symptoms     Balance Overall balance assessment: No apparent balance deficits (not formally assessed)                               Standardized Balance Assessment Standardized Balance Assessment : Dynamic Gait Index   Dynamic Gait Index Level Surface: Normal Change in Gait Speed: Normal Gait with Horizontal Head Turns: Normal Gait with Vertical Head Turns: Normal Gait and Pivot Turn: Normal Step Over Obstacle: Normal Step Around Obstacles: Normal Steps: Normal Total Score: 24      Cognition Arousal/Alertness: Awake/alert Behavior During Therapy: WFL for tasks assessed/performed Overall Cognitive Status: Impaired/Different from baseline Area of Impairment: Memory, Attention, Problem solving                   Current  Attention Level: Selective Memory: Decreased short-term memory       Problem Solving: Slow processing, Difficulty sequencing, Requires verbal cues          Exercises      General Comments General comments (skin integrity, edema, etc.): VSS on RA      Pertinent Vitals/Pain Pain  Assessment Pain Assessment: Faces Faces Pain Scale: No hurt Pain Location: L hand Pain Descriptors / Indicators: Discomfort Pain Intervention(s): Limited activity within patient's tolerance, Monitored during session    Home Living Family/patient expects to be discharged to:: Private residence   Available Help at Discharge: Family Type of Home: Mobile home Home Access: Stairs to enter Entrance Stairs-Rails: Right Entrance Stairs-Number of Steps: 4-5   Home Layout: One level Home Equipment: Conservation officer, nature (2 wheels);Wheelchair - manual;BSC/3in1;Shower seat      Prior Function            PT Goals (current goals can now be found in the care plan section) Acute Rehab PT Goals Patient Stated Goal: to go home PT Goal Formulation: All assessment and education complete, DC therapy Time For Goal Achievement: 07/09/22 Potential to Achieve Goals: Good Progress towards PT goals: Goals met/education completed, patient discharged from PT    Frequency    Min 3X/week      PT Plan Current plan remains appropriate;Equipment recommendations need to be updated    Co-evaluation              AM-PAC PT "6 Clicks" Mobility   Outcome Measure  Help needed turning from your back to your side while in a flat bed without using bedrails?: None Help needed moving from lying on your back to sitting on the side of a flat bed without using bedrails?: None Help needed moving to and from a bed to a chair (including a wheelchair)?: None Help needed standing up from a chair using your arms (e.g., wheelchair or bedside chair)?: None Help needed to walk in hospital room?: None Help needed climbing 3-5 steps with a railing? : A Little 6 Click Score: 23    End of Session   Activity Tolerance: Patient tolerated treatment well Patient left: in bed;with call bell/phone within reach Nurse Communication: Mobility status PT Visit Diagnosis: Other abnormalities of gait and mobility (R26.89);Other  symptoms and signs involving the nervous system (R29.898);Unsteadiness on feet (R26.81)     Time: 2585-2778 PT Time Calculation (min) (ACUTE ONLY): 9 min  Charges:  $Gait Training: 8-22 mins                     Lance Franklin, PT, DPT Acute Rehabilitation Services  Office: [phone]    Lance Franklin 06/27/2022, 1:05 PM

## 2022-06-27 NOTE — Evaluation (Signed)
Occupational Therapy Evaluation Patient Details Name: Lance Franklin MRN: [id number] DOB: 1981-11-24 Today's Date: 06/27/2022   History of Present Illness 41 year old male with history polysubstance abuse found down by co-workers 06/23/22. EMS arrived and witnessed seizure like activity followed by emesis x 1.  Patient given meds for seizures and intubated.  Was extubated 06/25/22.   Clinical Impression   PTA patient independent and working. Admitted for above and presents with mild unsteadiness and decreased cognition.  Pt oriented and follows commands but demonstrates decreased STM, attention and problem solving.  Recommend further functional cognition testing using pill box test, as pt eager to return to work.  He completes ADLs with supervision and mobility with supervision.  Noted L hand with dressing, per chart blisters from grabbing pipe at work, but no deficits in sensation or coordination noted and able to use functionally.  Will follow acutely, anticipate no further OT needs after dc.      Recommendations for follow up therapy are one component of a multi-disciplinary discharge planning process, led by the attending physician.  Recommendations may be updated based on patient status, additional functional criteria and insurance authorization.   Follow Up Recommendations  No OT follow up    Assistance Recommended at Discharge Intermittent Supervision/Assistance  Patient can return home with the following A little help with walking and/or transfers;A little help with bathing/dressing/bathroom;Assistance with cooking/housework;Assist for transportation;Direct supervision/assist for financial management;Direct supervision/assist for medications management    Functional Status Assessment  Patient has had a recent decline in their functional status and demonstrates the ability to make significant improvements in function in a reasonable and predictable amount of time.  Equipment  Recommendations  None recommended by OT    Recommendations for Other Services       Precautions / Restrictions Precautions Precautions: Fall Precaution Comments: seizure Restrictions Weight Bearing Restrictions: No      Mobility Bed Mobility Overal bed mobility: Modified Independent             General bed mobility comments: no assist required    Transfers Overall transfer level: Needs assistance   Transfers: Sit to/from Stand Sit to Stand: Supervision           General transfer comment: for safety      Balance Overall balance assessment: Mild deficits observed, not formally tested                                         ADL either performed or assessed with clinical judgement   ADL Overall ADL's : Needs assistance/impaired     Grooming: Supervision/safety;Standing           Upper Body Dressing : Set up;Sitting   Lower Body Dressing: Supervision/safety;Sit to/from stand   Toilet Transfer: Supervision/safety;Ambulation Toilet Transfer Details (indicate cue type and reason): for safety, IV pole mgmt         Functional mobility during ADLs: Supervision/safety;Cueing for safety       Vision   Vision Assessment?: No apparent visual deficits Additional Comments: brief assessment, appears [facility]     Perception     Praxis      Pertinent Vitals/Pain Pain Assessment Pain Assessment: Faces Faces Pain Scale: Hurts a little bit Pain Location: L hand Pain Intervention(s): Monitored during session     Hand Dominance Right   Extremity/Trunk Assessment Upper Extremity Assessment Upper Extremity Assessment: LUE deficits/detail LUE Deficits /  Details: blisters on L hand, wrapped by nursing, no deficits in sensation or coordination   Lower Extremity Assessment Lower Extremity Assessment: Defer to PT evaluation       Communication Communication Communication: No difficulties   Cognition Arousal/Alertness:  Awake/alert Behavior During Therapy: WFL for tasks assessed/performed Overall Cognitive Status: Impaired/Different from baseline Area of Impairment: Memory, Attention, Problem solving                   Current Attention Level: Selective Memory: Decreased recall of precautions       Problem Solving: Slow processing, Difficulty sequencing, Requires verbal cues General Comments: decreased attention, seqeucning (months backwards) with slow processing.  continue assessment     General Comments  VSS on RA    Exercises     Shoulder Instructions      Home Living Family/patient expects to be discharged to:: Private residence   Available Help at Discharge: Family Type of Home: Mobile home Home Access: Stairs to enter Entrance Stairs-Number of Steps: 4-5 Entrance Stairs-Rails: Right Home Layout: One level     Bathroom Shower/Tub: Chief Strategy Officer: Standard     Home Equipment: Agricultural consultant (2 wheels);Wheelchair - manual;BSC/3in1;Shower seat          Prior Functioning/Environment Prior Level of Function : Independent/Modified Independent;Working/employed                        OT Problem List: Decreased activity tolerance;Decreased knowledge of use of DME or AE;Decreased safety awareness;Decreased cognition      OT Treatment/Interventions: Self-care/ADL training;Therapeutic activities;Patient/family education;Cognitive remediation/compensation    OT Goals(Current goals can be found in the care plan section) Acute Rehab OT Goals Patient Stated Goal: get back to work OT Goal Formulation: With patient Time For Goal Achievement: 07/11/22 Potential to Achieve Goals: Good  OT Frequency: Min 2X/week    Co-evaluation              AM-PAC OT "6 Clicks" Daily Activity     Outcome Measure Help from another person eating meals?: None Help from another person taking care of personal grooming?: A Little Help from another person toileting,  which includes using toliet, bedpan, or urinal?: A Little Help from another person bathing (including washing, rinsing, drying)?: A Little Help from another person to put on and taking off regular upper body clothing?: A Little Help from another person to put on and taking off regular lower body clothing?: A Little 6 Click Score: 19   End of Session Nurse Communication: Mobility status  Activity Tolerance: Patient tolerated treatment well Patient left: in bed;with call bell/phone within reach  OT Visit Diagnosis: Other abnormalities of gait and mobility (R26.89);Other symptoms and signs involving cognitive function                Time: 1208-1228 OT Time Calculation (min): 20 min Charges:  OT General Charges $OT Visit: 1 Visit OT Evaluation $OT Eval Moderate Complexity: 1 Mod  Barry Brunner, OT Acute Rehabilitation Services Office [phone]   Lance Franklin 06/27/2022, 12:44 PM

## 2022-06-28 LAB — CULTURE, BLOOD (ROUTINE X 2): Special Requests: ADEQUATE

## 2022-06-28 NOTE — Discharge Summary (Signed)
Physician Discharge Summary   Patient: Lance Franklin MRN: [id number] DOB: 02/07/1981  Admit date:     06/23/2022  Discharge date: 06/27/2022  Discharge Physician: Lynden OxfordPranav Atlantis Delong  PCP: Patient, No Pcp Per  Recommendations at discharge: Follow-up with PCP in 1 week.  Discharge Diagnoses: Principal Problem:   Seizure (HCC) Acute toxic encephalopathy due to substance abuse with cocaine and THC and alcohol Alcohol abuse Status epilepticus in the setting of drug abuse Polysubstance abuse Acute kidney injury due to toxic ATN Acute rhabdomyolysis Hypokalemia,  Acute hypoxic/hypercapnic respiratory failure,  Hyponatremia,  Lactic acidosis,   Assessment and Plan: Acute toxic encephalopathy due to substance abuse with cocaine and THC and alcohol Alcohol abuse Status epilepticus in the setting of drug abuse Polysubstance abuse Acute hypoxic/hypercapnic respiratory failure, resolved Patient was found down by coworkers.  EMS was called.  Noticed seizure-like activity.  Patient was treated with Versed.  Intubated by EMS and was brought to the hospital.  Admitted to ICU. Extubated on 7/26. UDS positive for cocaine, cannabis, benzos. MRI brain negative for any acute abnormality. Neurology was consulted. Patient was started on AED due to ongoing seizure-like activity although EEG was negative for seizures. Patient was recommended not to Drive or operate any heavy machinery as per DMV Was also recommended not to drink alcohol. Counseled to quit substance abuse as well.  Acute kidney injury due to toxic ATN Acute rhabdomyolysis Hypokalemia, improved Hyponatremia, resolved Baseline serum creatinine 0.9. On admission serum creatinine 4.07. Treated aggressively with IV fluids. Currently serum creatinine normalizing to 1.22. Recommend patient to drink liquids aggressively. CK level was 2900 on admission peaked at 16,000 and now trending down to 9000. Patient does not have any evidence of  myositis or myopathy. For now recommend patient to drink plenty of fluids and minimize activity and patient can be discharged home with follow-up with PCP.  Lactic acidosis, improved From dehydration and seizure. Monitor.  Consultants: Neurology, family admission with PCCM Procedures performed:  EEG DISCHARGE MEDICATION: Allergies as of 06/27/2022   No Known Allergies      Medication List     TAKE these medications    CertaVite/Antioxidants Tabs Take 1 tablet by mouth daily.   folic acid 1 MG tablet Commonly known as: FOLVITE Take 1 tablet (1 mg total) by mouth daily.   levETIRAcetam 750 MG tablet Commonly known as: KEPPRA Take 1 tablet (750 mg total) by mouth 2 (two) times daily.   SSD 1 % cream Generic drug: silver sulfADIAZINE Apply topically daily.   thiamine 100 MG tablet Commonly known as: VITAMIN B1 Take 1 tablet (100 mg total) by mouth daily.               Discharge Care Instructions  (From admission, onward)           Start     Ordered   06/27/22 0000  Discharge wound care:       Comments: Daily silvadene creme on blister skin for 7 days. Cover with dry non adherent dressing.   06/27/22 1503            Disposition: Home Diet recommendation: Cardiac diet  Discharge Exam: Vitals:   06/27/22 0301 06/27/22 0500 06/27/22 0714 06/27/22 1136  BP: 121/68  117/76 127/75  Pulse: 68  68 66  Resp: 14  18 15   Temp: 98.5 F (36.9 C)  98.1 F (36.7 C) 98.2 F (36.8 C)  TempSrc: Oral  Oral Oral  SpO2: 91%  94% 95%  Weight:  80.9 kg    Height:       General: Appear in no distress; no visible Abnormal Neck Mass Or lumps, Conjunctiva normal Cardiovascular: S1 and S2 Present, no Murmur, Respiratory: good respiratory effort, Bilateral Air entry present and CTA, no Crackles, no wheezes Abdomen: Bowel Sound present, Non tender  Extremities: no Pedal edema Neurology: alert and oriented to time, place, and person  Gait not checked due to  patient safety concerns Filed Weights   06/23/22 2040 06/26/22 2313 06/27/22 0500  Weight: 77.1 kg 80.9 kg 80.9 kg   Condition at discharge: stable  The results of significant diagnostics from this hospitalization (including imaging, microbiology, ancillary and laboratory) are listed below for reference.   Imaging Studies: DG Chest Port 1 View  Result Date: 06/25/2022 CLINICAL DATA:  Ventilator dependent respiratory failure. EXAM: PORTABLE CHEST 1 VIEW COMPARISON:  Portable chest 06/23/2022 FINDINGS: 5:16 a.m. ETT tip is 5 cm from carina. NGT tip is probably about the body of the stomach based on the position of the proximal side-hole. The lungs are clear. The mediastinum is normally outlined. Heart size and vasculature are normal and no pleural effusion is seen. No acute osseous findings. IMPRESSION: No active disease. Support tubes are adequately inserted. Stable intubated chest. Electronically Signed   By: Almira Bar M.D.   On: 06/25/2022 07:36   MR BRAIN WO CONTRAST  Result Date: 06/24/2022 CLINICAL DATA:  New onset seizure.  Found down. EXAM: MRI HEAD WITHOUT CONTRAST TECHNIQUE: Multiplanar, multiecho pulse sequences of the brain and surrounding structures were obtained without intravenous contrast. COMPARISON:  CT head dated 1 day prior. FINDINGS: Brain: There is no acute intracranial hemorrhage, extra-axial fluid collection, or acute infarct. Parenchymal volume is normal. The ventricles are normal in size. Gray-white differentiation is preserved. Parenchymal signal is normal. There is no structural or migration abnormality. The hippocampi are normal in signal and architecture. The corpus callosum is normally formed. The pituitary and suprasellar regions are normal. There is no mass lesion.  There is no mass effect or midline shift. Vascular: Normal flow voids. Skull and upper cervical spine: Normal marrow signal. Sinuses/Orbits: The paranasal sinuses are clear. The globes and orbits are  unremarkable. Other: An endotracheal tube is noted. IMPRESSION: Normal appearance of the brain with no acute intracranial pathology or epileptogenic focus identified. Electronically Signed   By: Lesia Hausen M.D.   On: 06/24/2022 18:25   Overnight EEG with video  Result Date: 06/24/2022 Charlsie Quest, MD     06/25/2022 10:01 AM Patient Name: JEFFERSON FULLAM MRN: [id number] Epilepsy Attending: Charlsie Quest Referring Physician/Provider: Caryl Pina, MD Duration: 06/23/2022 2112 to 06/24/2022 2112 Patient history: 41 year old male with altered mental status and status epilepticus.  EEG to evaluate for seizure Level of alertness: comatose AEDs during EEG study: Keppra, propofol Technical aspects: This EEG study was done with scalp electrodes positioned according to the 10-20 International system of electrode placement. Electrical activity was acquired at a sampling rate of 500Hz  and reviewed with a high frequency filter of 70Hz  and a low frequency filter of 1Hz . EEG data were recorded continuously and digitally stored. Description: EEG showed continuous generalized 3 to 5 Hz theta and delta slowing admixed with an excessive amount of 15 to 18 Hz beta activity distributed symmetrically and diffusely. Hyperventilation and photic stimulation were not performed.   Patient was unhooked between 06/24/2022 1629 to 1914 for MRI brain ABNORMALITY - Continuous slow, generalized - Excessive beta, generalized IMPRESSION: This study is suggestive  of severe diffuse encephalopathy, nonspecific etiology but likely related to sedation. No seizures or epileptiform discharges were seen throughout the recording. Charlsie Quest   DG Abd Portable 1V  Result Date: 06/24/2022 CLINICAL DATA:  Tube placement EXAM: PORTABLE ABDOMEN - 1 VIEW COMPARISON:  None Available. FINDINGS: Nasogastric tube tip and side port overlie the stomach. Nonobstructive bowel gas pattern. IMPRESSION: Nasogastric tube tip and side port overlie the  stomach. Electronically Signed   By: Caprice Renshaw M.D.   On: 06/24/2022 08:03   CT Head Wo Contrast  Result Date: 06/23/2022 CLINICAL DATA:  Patient was found down by coworkers. Witnessed seizures leg activity followed by emesis. Head and neck trauma EXAM: CT HEAD WITHOUT CONTRAST CT CERVICAL SPINE WITHOUT CONTRAST TECHNIQUE: Multidetector CT imaging of the head and cervical spine was performed following the standard protocol without intravenous contrast. Multiplanar CT image reconstructions of the cervical spine were also generated. RADIATION DOSE REDUCTION: This exam was performed according to the departmental dose-optimization program which includes automated exposure control, adjustment of the mA and/or kV according to patient size and/or use of iterative reconstruction technique. COMPARISON:  CT examination dated September 15, 2018 FINDINGS: CT HEAD FINDINGS Brain: No evidence of acute infarction, hemorrhage, hydrocephalus, extra-axial collection or mass lesion/mass effect. Vascular: No hyperdense vessel or unexpected calcification. Skull: Normal. Negative for fracture or focal lesion. Sinuses/Orbits: No acute finding. Other: None. CT CERVICAL SPINE FINDINGS Alignment: Normal. Skull base and vertebrae: No acute fracture. No primary bone lesion or focal pathologic process. Soft tissues and spinal canal: No prevertebral fluid or swelling. No visible canal hematoma. Disc levels: No significant disc protrusion, spinal canal or neural foraminal stenosis. Upper chest: Negative. Other: Endotracheal tube and feeding tube partially imaged. IMPRESSION: CT head: 1.  No acute intracranial abnormality. CT cervical spine: 1. No acute fracture or traumatic subluxation. Paraspinal soft tissues are unremarkable. 2.  Endotracheal tube and feeding tube partially imaged. Electronically Signed   By: Larose Hires D.O.   On: 06/23/2022 22:49   CT Cervical Spine Wo Contrast  Result Date: 06/23/2022 CLINICAL DATA:  Patient was  found down by coworkers. Witnessed seizures leg activity followed by emesis. Head and neck trauma EXAM: CT HEAD WITHOUT CONTRAST CT CERVICAL SPINE WITHOUT CONTRAST TECHNIQUE: Multidetector CT imaging of the head and cervical spine was performed following the standard protocol without intravenous contrast. Multiplanar CT image reconstructions of the cervical spine were also generated. RADIATION DOSE REDUCTION: This exam was performed according to the departmental dose-optimization program which includes automated exposure control, adjustment of the mA and/or kV according to patient size and/or use of iterative reconstruction technique. COMPARISON:  CT examination dated September 15, 2018 FINDINGS: CT HEAD FINDINGS Brain: No evidence of acute infarction, hemorrhage, hydrocephalus, extra-axial collection or mass lesion/mass effect. Vascular: No hyperdense vessel or unexpected calcification. Skull: Normal. Negative for fracture or focal lesion. Sinuses/Orbits: No acute finding. Other: None. CT CERVICAL SPINE FINDINGS Alignment: Normal. Skull base and vertebrae: No acute fracture. No primary bone lesion or focal pathologic process. Soft tissues and spinal canal: No prevertebral fluid or swelling. No visible canal hematoma. Disc levels: No significant disc protrusion, spinal canal or neural foraminal stenosis. Upper chest: Negative. Other: Endotracheal tube and feeding tube partially imaged. IMPRESSION: CT head: 1.  No acute intracranial abnormality. CT cervical spine: 1. No acute fracture or traumatic subluxation. Paraspinal soft tissues are unremarkable. 2.  Endotracheal tube and feeding tube partially imaged. Electronically Signed   By: Larose Hires D.O.   On:  06/23/2022 22:49   DG Chest Portable 1 View  Result Date: 06/23/2022 CLINICAL DATA:  Seizure activity, status post endotracheal tube and orogastric tube placement. EXAM: PORTABLE CHEST 1 VIEW COMPARISON:  September 15, 2018 FINDINGS: An endotracheal tube is seen  with its distal tip approximately 4.8 cm from the carina. An orogastric tube is in place with its distal tip overlying the body of the stomach. Its distal side hole sits approximally 4.1 cm distal to the expected region of the gastroesophageal junction. The heart size and mediastinal contours are within normal limits. Both lungs are clear. The visualized skeletal structures are unremarkable. IMPRESSION: 1. Endotracheal tube and orogastric tube positioning, as described above. 2. No acute or active cardiopulmonary disease. Electronically Signed   By: Aram Candela M.D.   On: 06/23/2022 19:47    Microbiology: Results for orders placed or performed during the hospital encounter of 06/23/22  Resp Panel by RT-PCR (Flu A&B, Covid)     Status: None   Collection Time: 06/23/22  7:47 PM   Specimen: Nasal Swab  Result Value Ref Range Status   SARS Coronavirus 2 by RT PCR NEGATIVE NEGATIVE Final    Comment: (NOTE) SARS-CoV-2 target nucleic acids are NOT DETECTED.  The SARS-CoV-2 RNA is generally detectable in upper respiratory specimens during the acute phase of infection. The lowest concentration of SARS-CoV-2 viral copies this assay can detect is 138 copies/mL. A negative result does not preclude SARS-Cov-2 infection and should not be used as the sole basis for treatment or other patient management decisions. A negative result may occur with  improper specimen collection/handling, submission of specimen other than nasopharyngeal swab, presence of viral mutation(s) within the areas targeted by this assay, and inadequate number of viral copies(<138 copies/mL). A negative result must be combined with clinical observations, patient history, and epidemiological information. The expected result is Negative.  Fact Sheet for Patients:  BloggerCourse.com  Fact Sheet for Healthcare Providers:  SeriousBroker.it  This test is no t yet approved or cleared  by the [place] FDA and  has been authorized for detection and/or diagnosis of SARS-CoV-2 by FDA under an Emergency Use Authorization (EUA). This EUA will remain  in effect (meaning this test can be used) for the duration of the COVID-19 declaration under Section 564(b)(1) of the Act, 21 U.S.C.section 360bbb-3(b)(1), unless the authorization is terminated  or revoked sooner.       Influenza A by PCR NEGATIVE NEGATIVE Final   Influenza B by PCR NEGATIVE NEGATIVE Final    Comment: (NOTE) The Xpert Xpress SARS-CoV-2/FLU/RSV plus assay is intended as an aid in the diagnosis of influenza from Nasopharyngeal swab specimens and should not be used as a sole basis for treatment. Nasal washings and aspirates are unacceptable for Xpert Xpress SARS-CoV-2/FLU/RSV testing.  Fact Sheet for Patients: BloggerCourse.com  Fact Sheet for Healthcare Providers: SeriousBroker.it  This test is not yet approved or cleared by the [place] FDA and has been authorized for detection and/or diagnosis of SARS-CoV-2 by FDA under an Emergency Use Authorization (EUA). This EUA will remain in effect (meaning this test can be used) for the duration of the COVID-19 declaration under Section 564(b)(1) of the Act, 21 U.S.C. section 360bbb-3(b)(1), unless the authorization is terminated or revoked.  Performed at [facility], [address], [place], [place] [address]   Urine Culture     Status: None   Collection Time: 06/23/22  7:47 PM   Specimen: In/Out Cath Urine  Result Value Ref Range  Status   Specimen Description IN/OUT CATH URINE  Final   Special Requests NONE  Final   Culture   Final    NO GROWTH Performed at [facility], [address] [place], [place] [address]    Report Status 06/24/2022 FINAL  Final  Blood culture (routine x 2)     Status: None   Collection Time: 06/23/22  7:50 PM   Specimen: BLOOD  Result Value Ref Range  Status   Specimen Description BLOOD RIGHT ANTECUBITAL  Final   Special Requests   Final    BOTTLES DRAWN AEROBIC AND ANAEROBIC Blood Culture adequate volume   Culture   Final    NO GROWTH 5 DAYS Performed at [facility], [address] [place], [place] [address]    Report Status 06/28/2022 FINAL  Final  Blood culture (routine x 2)     Status: None   Collection Time: 06/23/22  8:00 PM   Specimen: BLOOD RIGHT FOREARM  Result Value Ref Range Status   Specimen Description BLOOD RIGHT FOREARM  Final   Special Requests   Final    BOTTLES DRAWN AEROBIC AND ANAEROBIC Blood Culture results may not be optimal due to an inadequate volume of blood received in culture bottles   Culture   Final    NO GROWTH 5 DAYS Performed at [facility], [address], [place], [place] [address]    Report Status 06/28/2022 FINAL  Final  C Difficile Quick Screen w PCR reflex     Status: None   Collection Time: 06/23/22  9:41 PM   Specimen: STOOL  Result Value Ref Range Status   C Diff antigen NEGATIVE NEGATIVE Final   C Diff toxin NEGATIVE NEGATIVE Final   C Diff interpretation No C. difficile detected.  Final    Comment: Performed at [facility], [address], [place], [place] [address]  Gastrointestinal Panel by PCR , Stool     Status: None   Collection Time: 06/23/22  9:41 PM   Specimen: STOOL  Result Value Ref Range Status   Campylobacter species NOT DETECTED NOT DETECTED Final   Plesimonas shigelloides NOT DETECTED NOT DETECTED Final   Salmonella species NOT DETECTED NOT DETECTED Final   Yersinia enterocolitica NOT DETECTED NOT DETECTED Final   Vibrio species NOT DETECTED NOT DETECTED Final   Vibrio cholerae NOT DETECTED NOT DETECTED Final   Enteroaggregative E coli (EAEC) NOT DETECTED NOT DETECTED Final   Enteropathogenic E coli (EPEC) NOT DETECTED NOT DETECTED Final   Enterotoxigenic E coli (ETEC) NOT DETECTED NOT DETECTED Final   Shiga like toxin producing E coli (STEC)  NOT DETECTED NOT DETECTED Final   Shigella/Enteroinvasive E coli (EIEC) NOT DETECTED NOT DETECTED Final   Cryptosporidium NOT DETECTED NOT DETECTED Final   Cyclospora cayetanensis NOT DETECTED NOT DETECTED Final   Entamoeba histolytica NOT DETECTED NOT DETECTED Final   Giardia lamblia NOT DETECTED NOT DETECTED Final   Adenovirus F40/41 NOT DETECTED NOT DETECTED Final   Astrovirus NOT DETECTED NOT DETECTED Final   Norovirus GI/GII NOT DETECTED NOT DETECTED Final   Rotavirus A NOT DETECTED NOT DETECTED Final   Sapovirus (I, II, IV, and V) NOT DETECTED NOT DETECTED Final    Comment: Performed at [facility], [address] [place], [place] [address]  MRSA Next Gen by PCR, Nasal     Status: None   Collection Time: 06/24/22 12:24 AM   Specimen: Nasal Mucosa; Nasal Swab  Result Value Ref Range Status   MRSA by PCR Next Gen NOT DETECTED NOT DETECTED Final    Comment: (NOTE) The GeneXpert MRSA Assay (FDA approved for NASAL specimens only), is one component of a comprehensive MRSA colonization surveillance program. It is not intended to diagnose MRSA infection nor to guide or monitor treatment for MRSA infections. Test performance is not FDA approved in patients less than 82 years old. Performed at [facility], [address] [place], [place] [address]     Labs: CBC: Recent Labs  Lab 06/23/22 1950 06/23/22 1959 06/24/22 0421 06/25/22 0444 06/26/22 0239  WBC 33.6*  --  17.0* 15.2* 15.5*  NEUTROABS 27.5*  --   --   --   --   HGB 15.8 16.3 13.2 12.1* 12.4*  HCT 46.7 48.0 38.4* 35.4* 36.4*  MCV 100.0  --  98.2 100.6* 99.2  PLT 323  --  169 149* 147*   Basic Metabolic Panel: Recent Labs  Lab 06/23/22 1950 06/23/22 1959 06/24/22 0421 06/25/22 0444 06/26/22 0239 06/27/22 0335  NA 146* 146* 143 142 138 139  K 5.2* 4.7 3.9 4.2 4.2 3.4*  CL 111  --  113* 117* 112* 104  CO2 20*  --  19* 20* 18* 26  GLUCOSE 120*  --  106* 79 85 86  BUN 31*  --  34* 23* 15 9   CREATININE 4.07*  --  2.85* 1.69* 1.39* 1.22  CALCIUM 9.7  --  8.5* 8.0* 8.2* 8.5*  MG  --   --  2.1 2.3 2.0 1.6*  PHOS  --   --  3.1 2.9 3.7 3.9   Liver Function Tests: Recent Labs  Lab 06/23/22 1950 06/26/22 0239 06/27/22 0335  AST 77*  --   --   ALT 37  --   --   ALKPHOS 66  --   --   BILITOT 0.9  --   --   PROT 8.0  --   --   ALBUMIN 5.1* 2.9* 3.2*   CBG: Recent Labs  Lab 06/23/22 2130  GLUCAP 74    Discharge time spent: greater than 30 minutes.  Signed: Lynden Oxford, MD [facility] 06/27/2022
